# Patient Record
Sex: Female | Born: 1995 | Race: Black or African American | Hispanic: No | Marital: Single | Smoking: Former smoker
Health system: Southern US, Community
[De-identification: ages and names within clinical notes are randomized; demographics above are authoritative.]

## PROBLEM LIST (undated history)

## (undated) DIAGNOSIS — I10 Essential (primary) hypertension: Secondary | ICD-10-CM

---
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
## 2013-03-31 ENCOUNTER — Emergency Department (HOSPITAL_BASED_OUTPATIENT_CLINIC_OR_DEPARTMENT_OTHER): Payer: Medicaid Other

## 2013-03-31 ENCOUNTER — Encounter (HOSPITAL_BASED_OUTPATIENT_CLINIC_OR_DEPARTMENT_OTHER): Payer: Self-pay | Admitting: *Deleted

## 2013-03-31 ENCOUNTER — Emergency Department (HOSPITAL_BASED_OUTPATIENT_CLINIC_OR_DEPARTMENT_OTHER)
Admission: EM | Admit: 2013-03-31 | Discharge: 2013-03-31 | Disposition: A | Discharge status: Home / Self Care | Payer: Medicaid Other | Attending: Emergency Medicine | Admitting: Emergency Medicine

## 2013-03-31 DIAGNOSIS — N10 Acute tubulo-interstitial nephritis: Secondary | ICD-10-CM | POA: Insufficient documentation

## 2013-03-31 DIAGNOSIS — Z79899 Other long term (current) drug therapy: Secondary | ICD-10-CM | POA: Insufficient documentation

## 2013-03-31 DIAGNOSIS — R110 Nausea: Secondary | ICD-10-CM | POA: Insufficient documentation

## 2013-03-31 DIAGNOSIS — Z3202 Encounter for pregnancy test, result negative: Secondary | ICD-10-CM | POA: Insufficient documentation

## 2013-03-31 DIAGNOSIS — R109 Unspecified abdominal pain: Secondary | ICD-10-CM | POA: Insufficient documentation

## 2013-03-31 LAB — COMPREHENSIVE METABOLIC PANEL
ALT: 14 U/L (ref 0–35)
AST: 17 U/L (ref 0–37)
Alkaline Phosphatase: 51 U/L (ref 39–117)
CO2: 27 mEq/L (ref 19–32)
Calcium: 9.4 mg/dL (ref 8.4–10.5)
GFR calc non Af Amer: 82 mL/min — ABNORMAL LOW (ref >90)
Potassium: 3.5 mEq/L (ref 3.5–5.1)
Sodium: 136 mEq/L (ref 135–145)
Total Protein: 7.6 g/dL (ref 6.0–8.3)

## 2013-03-31 LAB — URINE MICROSCOPIC-ADD ON

## 2013-03-31 LAB — CBC WITH DIFFERENTIAL/PLATELET
Band Neutrophils: 1 % (ref 0–10)
Blasts: 0 %
HCT: 38.3 % (ref 36.0–46.0)
MCH: 31.2 pg (ref 26.0–34.0)
MCHC: 34.2 g/dL (ref 30.0–36.0)
MCV: 91.2 fL (ref 78.0–100.0)
Metamyelocytes Relative: 0 %
Monocytes Absolute: 0.5 10*3/uL (ref 0.1–1.0)
Monocytes Relative: 5 % (ref 3–12)
Myelocytes: 0 %
Platelets: 226 10*3/uL (ref 150–400)
RDW: 12.6 % (ref 11.5–15.5)
WBC: 10.5 10*3/uL (ref 4.0–10.5)

## 2013-03-31 LAB — URINALYSIS, ROUTINE W REFLEX MICROSCOPIC
Ketones, ur: 15 mg/dL — AB
Nitrite: POSITIVE — AB
Protein, ur: 30 mg/dL — AB
Urobilinogen, UA: 1 mg/dL (ref 0.0–1.0)

## 2013-03-31 MED ORDER — CEPHALEXIN 500 MG PO CAPS
500.0000 mg | ORAL_CAPSULE | Freq: Four times a day (QID) | ORAL | Status: DC
Start: 2013-03-31 — End: 2014-10-13

## 2013-03-31 MED ORDER — IOHEXOL 300 MG/ML  SOLN
100.0000 mL | Freq: Once | INTRAMUSCULAR | Status: AC | PRN
  Administered 2013-03-31: 100 mL via INTRAVENOUS

## 2013-03-31 MED ORDER — ONDANSETRON HCL 4 MG PO TABS: 4.0000 mg | ORAL_TABLET | Freq: Four times a day (QID) | ORAL | Status: DC

## 2013-03-31 MED ORDER — HYDROCODONE-ACETAMINOPHEN 5-325 MG PO TABS: 1.0000 | ORAL_TABLET | Freq: Four times a day (QID) | ORAL | Status: DC | PRN

## 2013-03-31 MED ORDER — IOHEXOL 300 MG/ML  SOLN
50.0000 mL | Freq: Once | INTRAMUSCULAR | Status: AC | PRN
  Administered 2013-03-31: 50 mL via ORAL

## 2013-03-31 NOTE — ED Notes (Signed)
Pt reports headache "like a band around the top of my head" x 2 days. No relief with motrin or sinus otc meds.

## 2013-03-31 NOTE — ED Notes (Signed)
Patient transported to CT 

## 2013-03-31 NOTE — ED Notes (Signed)
PT STATES THAT HER LMP WAS 2 MONTHS AGO AND REQUESTS A PREGNANCY TEST.

## 2013-03-31 NOTE — ED Provider Notes (Addendum)
History    CSN: [id number] Arrival date & time 03/31/13  4098  First MD Initiated Contact with Patient 03/31/13 [phone]     Chief Complaint  Patient presents with  . Headache  . REQUESTS PREG TEST    (Consider location/radiation/quality/duration/timing/severity/associated sxs/prior Treatment) Patient is a 17 y.o. female presenting with headaches. The history is provided by the patient.  Headache Pain location:  Frontal Quality:  Dull Radiates to:  Does not radiate Severity currently:  5/10 Severity at highest:  5/10 Onset quality:  Gradual Duration:  2 days Timing:  Constant Progression:  Unchanged Chronicity:  New Similar to prior headaches: no   Relieved by:  Nothing Worsened by:  Light Ineffective treatments:  Acetaminophen and aspirin (sinus medicine) Associated symptoms: abdominal pain and nausea   Associated symptoms: no blurred vision, no congestion, no cough, no diarrhea, no fever, no focal weakness, no myalgias, no neck pain, no sinus pressure, no URI, no visual change and no vomiting   Associated symptoms comment:  Pt states that for 2 days she has had fluctuating right abd pain that is worse with movement such as sitting up from a lying position. Risk factors comment:  Seen by PCP on friday and dx with UTI  History reviewed. No pertinent past medical history. History reviewed. No pertinent past surgical history. History reviewed. No pertinent family history. History  Substance Use Topics  . Smoking status: Never Smoker   . Smokeless tobacco: Not on file  . Alcohol Use: No   OB History   Grav Para Term Preterm Abortions TAB SAB Ect Mult Living                 Review of Systems  Constitutional: Negative for fever.  HENT: Negative for congestion, neck pain and sinus pressure.   Eyes: Negative for blurred vision.  Respiratory: Negative for cough.   Gastrointestinal: Positive for nausea and abdominal pain. Negative for vomiting and diarrhea.  Genitourinary:  Negative for dysuria, vaginal bleeding and vaginal discharge.       LMP 2 months ago  Musculoskeletal: Negative for myalgias.  Neurological: Positive for headaches. Negative for focal weakness.  All other systems reviewed and are negative.    Allergies  Review of patient's allergies indicates no known allergies.  Home Medications   Current Outpatient Rx  Name  Route  Sig  Dispense  Refill  . metroNIDAZOLE (FLAGYL) 500 MG tablet   Oral   Take 1 tablet (500 mg total) by mouth 2 (two) times daily. One po bid x 7 days   14 tablet   0   . permethrin (ELIMITE) 5 % cream      Apply a generous amount of cream from head to feet, leave on for 8 hours, wash off with soap and water. Repeat application if living mites present 14 days after initial treatment.   60 g   0   . ranitidine (ZANTAC) 150 MG tablet   Oral   Take 1 tablet (150 mg total) by mouth 2 (two) times daily.   60 tablet   0    BP 108/66  Pulse 94  Temp(Src) 99.5 F (37.5 C) (Oral)  Resp 16  Ht 5\' 3"  (1.6 m)  Wt 165 lb (74.844 kg)  BMI 29.24 kg/m2  SpO2 100%  LMP 01/29/2013 Physical Exam  Nursing note and vitals reviewed. Constitutional: She is oriented to person, place, and time. She appears well-developed and well-nourished. No distress.  HENT:  Head: Normocephalic and atraumatic.  Right Ear: Tympanic membrane and ear canal normal.  Left Ear: Tympanic membrane and ear canal normal.  Nose: Nose normal.  Mouth/Throat: Oropharynx is clear and moist.  Eyes: Conjunctivae and EOM are normal. Pupils are equal, round, and reactive to light.  Neck: Normal range of motion. Neck supple.  Cardiovascular: Normal rate, regular rhythm and intact distal pulses.   No murmur heard. Pulmonary/Chest: Effort normal and breath sounds normal. No respiratory distress. She has no wheezes. She has no rales.  Abdominal: Soft. She exhibits no distension. There is tenderness. There is rebound. There is no guarding.     Musculoskeletal: Normal range of motion. She exhibits no edema and no tenderness.  Neurological: She is alert and oriented to person, place, and time.  Skin: Skin is warm and dry. No rash noted. No erythema.  Psychiatric: She has a normal mood and affect. Her behavior is normal.    ED Course  Procedures (including critical care time) Labs Reviewed  URINALYSIS, ROUTINE W REFLEX MICROSCOPIC - Abnormal; Notable for the following:    Color, Urine AMBER (*)    APPearance TURBID (*)    Hgb urine dipstick SMALL (*)    Bilirubin Urine SMALL (*)    Ketones, ur 15 (*)    Protein, ur 30 (*)    Nitrite POSITIVE (*)    Leukocytes, UA MODERATE (*)    All other components within normal limits  COMPREHENSIVE METABOLIC PANEL - Abnormal; Notable for the following:    Glucose, Bld 108 (*)    GFR calc non Af Amer 82 (*)    All other components within normal limits  URINE MICROSCOPIC-ADD ON - Abnormal; Notable for the following:    Squamous Epithelial / LPF MANY (*)    Bacteria, UA MANY (*)    All other components within normal limits  URINE CULTURE  PREGNANCY, URINE  CBC WITH DIFFERENTIAL  LIPASE, BLOOD   Ct Abdomen Pelvis W Contrast  03/31/2013   *RADIOLOGY REPORT*  Clinical Data: Right-sided abdominal pain.  Nausea.  CT ABDOMEN AND PELVIS WITH CONTRAST  Technique:  Multidetector CT imaging of the abdomen and pelvis was performed following the standard protocol during bolus administration of intravenous contrast.  Contrast: 50mL OMNIPAQUE IOHEXOL 300 MG/ML  SOLN, OMNIPAQUE IOHEXOL 300 MG/ML  SOLN  Comparison: None.  Findings: The liver, spleen, pancreas, and adrenal glands appear unremarkable.  The gallbladder and biliary system appear unremarkable.  Patchy regions of low density/hypo enhancement in the right kidney are accompanied by a faint right perirenal stranding. Although these regions are indistinct, no definite peripheral rim sign is observed.  No specific renal arterial  abnormalities are observed. No stones or hydronephrosis.  Left kidney unremarkable.  Retroaortic left renal vein incidentally noted.  No pathologic adenopathy observed.  Urinary bladder unremarkable. The uterus and adnexa appear unremarkable.  No free pelvic fluid.  Appendix normal.  IMPRESSION:  1.  Mild right perirenal stranding associated with to indistinct regions of abnormal low enhancement in the right mid to upper kidney.  Pyelonephritis is favored.  Renal infarcts are not favored due to lack of rim sign, the patient age, and the lack of obvious renal arterial abnormality.  Correlate with urine analysis and urine culture.   Original Report Authenticated By: Gaylyn Rong, M.D.   1. Pyelonephritis, acute     MDM   Patient is here complaining of 2 days of headache and has been a band like pattern over her forehead with mild nausea but no vomiting.  She denies fever, URI symptoms or tick bite. No neck pain.  Well appearing on exam. Patient's last menstrual period was over 2 months ago and she has not taken urine pregnancy test. This could be the result of her headache. Headache symptoms are not suggestive of subarachnoid hemorrhage, infectious etiology or [facility] spotted fever.  Patient states she's been on antibiotics for the last 4 days for a UTI when she was seen by her doctor but she did not have a urine pregnancy test done at that time. She denies any vaginal discharge but states she has had persistently worsening right-sided abdominal pain that is worse with movement and eating.  UA contaminated with many epithelial cells and unclear if true UTI and abx for 4 days and UPT neg.  Concern for possible appy.  CBC, CMP and CT abd/pelvis pending.  Low concern for pelvic or ovarian pathology at this time given exam and hx.  10:51 AM CT showing pyelo.  No other acute findings.  Pt currently on macrobid and will change to keflex and culture sent.  Gwyneth Sprout, MD 03/31/13 1051  Gwyneth Sprout, MD 03/31/13 1053

## 2013-04-02 LAB — URINE CULTURE

## 2013-04-03 NOTE — ED Notes (Signed)
Post ED Visit - Positive Culture Follow-up  Culture report reviewed by antimicrobial stewardship pharmacist: []  Wes Dulaney, Pharm.D., BCPS [x]  Celedonio Miyamoto, [address].D., BCPS []  Georgina Pillion, Pharm.D., BCPS []  [place], [place].D., BCPS, AAHIVP []  Estella Husk, Pharm.D., BCPS, AAHIVP  Positive urine culture Treated with Cephalexin, organism sensitive to the same and no further patient follow-up is required at this time.  Larena Sox 04/03/2013, 2:16 PM

## 2014-10-13 ENCOUNTER — Encounter: Payer: Self-pay | Admitting: *Deleted

## 2014-10-13 ENCOUNTER — Emergency Department (INDEPENDENT_AMBULATORY_CARE_PROVIDER_SITE_OTHER)
Admission: EM | Admit: 2014-10-13 | Discharge: 2014-10-13 | Disposition: A | Discharge status: Home / Self Care | Payer: BLUE CROSS/BLUE SHIELD | Source: Home / Self Care | Attending: Family Medicine | Admitting: Family Medicine

## 2014-10-13 DIAGNOSIS — N760 Acute vaginitis: Secondary | ICD-10-CM

## 2014-10-13 DIAGNOSIS — B9689 Other specified bacterial agents as the cause of diseases classified elsewhere: Secondary | ICD-10-CM

## 2014-10-13 DIAGNOSIS — R109 Unspecified abdominal pain: Secondary | ICD-10-CM

## 2014-10-13 DIAGNOSIS — N898 Other specified noninflammatory disorders of vagina: Secondary | ICD-10-CM

## 2014-10-13 DIAGNOSIS — A499 Bacterial infection, unspecified: Secondary | ICD-10-CM

## 2014-10-13 DIAGNOSIS — Z202 Contact with and (suspected) exposure to infections with a predominantly sexual mode of transmission: Secondary | ICD-10-CM

## 2014-10-13 LAB — POCT URINALYSIS DIP (MANUAL ENTRY)
Blood, UA: NEGATIVE
GLUCOSE UA: NEGATIVE
Leukocytes, UA: NEGATIVE
NITRITE UA: NEGATIVE
Protein Ur, POC: 30
Urobilinogen, UA: 0.2 (ref 0–1)
pH, UA: 5.5 (ref 5–8)

## 2014-10-13 LAB — POCT URINE PREGNANCY: PREG TEST UR: NEGATIVE

## 2014-10-13 MED ORDER — METRONIDAZOLE 0.75 % VA GEL: VAGINAL | Status: DC

## 2014-10-13 NOTE — ED Notes (Signed)
Pt c/o lower abd pain x 2 wks. She also c/o white thick vaginal d/c x 1 day. Denies dysuria or fever.

## 2014-10-13 NOTE — Discharge Instructions (Signed)
Bacterial Vaginosis Bacterial vaginosis is a vaginal infection that occurs when the normal balance of bacteria in the vagina is disrupted. It results from an overgrowth of certain bacteria. This is the most common vaginal infection in women of childbearing age. Treatment is important to prevent complications, especially in pregnant women, as it can cause a premature delivery. CAUSES  Bacterial vaginosis is caused by an increase in harmful bacteria that are normally present in smaller amounts in the vagina. Several different kinds of bacteria can cause bacterial vaginosis. However, the reason that the condition develops is not fully understood. RISK FACTORS Certain activities or behaviors can put you at an increased risk of developing bacterial vaginosis, including:  Having a new sex partner or multiple sex partners.  Douching.  Using an intrauterine device (IUD) for contraception. Women do not get bacterial vaginosis from toilet seats, bedding, swimming pools, or contact with objects around them. SIGNS AND SYMPTOMS  Some women with bacterial vaginosis have no signs or symptoms. Common symptoms include:  Grey vaginal discharge.  A fishlike odor with discharge, especially after sexual intercourse.  Itching or burning of the vagina and vulva.  Burning or pain with urination. DIAGNOSIS  Your health care provider will take a medical history and examine the vagina for signs of bacterial vaginosis. A sample of vaginal fluid may be taken. Your health care provider will look at this sample under a microscope to check for bacteria and abnormal cells. A vaginal pH test may also be done.  TREATMENT  Bacterial vaginosis may be treated with antibiotic medicines. These may be given in the form of a pill or a vaginal cream. A second round of antibiotics may be prescribed if the condition comes back after treatment.  HOME CARE INSTRUCTIONS   Only take over-the-counter or prescription medicines as  directed by your health care provider.  If antibiotic medicine was prescribed, take it as directed. Make sure you finish it even if you start to feel better.  Do not have sex until treatment is completed.  Tell all sexual partners that you have a vaginal infection. They should see their health care provider and be treated if they have problems, such as a mild rash or itching.  Practice safe sex by using condoms and only having one sex partner. SEEK MEDICAL CARE IF:   Your symptoms are not improving after 3 days of treatment.  You have increased discharge or pain.  You have a fever. MAKE SURE YOU:   Understand these instructions.  Will watch your condition.  Will get help right away if you are not doing well or get worse. FOR MORE INFORMATION  Centers for Disease Control and Prevention, Division of STD Prevention: www.cdc.gov/std American Sexual Health Association (ASHA): www.ashastd.org  Document Released: 09/24/2005 Document Revised: 07/15/2013 Document Reviewed: 05/06/2013 ExitCare Patient Information 2015 [employer]. This information is not intended to replace advice given to you by your health care provider. Make sure you discuss any questions you have with your health care provider.  

## 2014-10-13 NOTE — ED Provider Notes (Signed)
CSN: [id number]     Arrival date & time 10/13/14  1849 History   First MD Initiated Contact with Patient 10/13/14 1938     Chief Complaint  Patient presents with  . Vaginal Discharge  . Abdominal Pain      HPI Comments: Patient complains of vague mild lower abdominal discomfort for two weeks.  She has had white vaginal discharge for one day.  No nausea/vomiting.  No urinary symptoms.  No fevers, chills, and sweats.  Patient's last menstrual period was 09/03/2014.  She states that [facility].  Patient is a 19 y.o. female presenting with vaginal discharge. The history is provided by the patient.  Vaginal Discharge Quality:  Thick and white Severity:  Moderate Onset quality:  Sudden Duration:  1 day Timing:  Constant Progression:  Unchanged Chronicity:  New Relieved by:  Nothing Worsened by:  Nothing tried Ineffective treatments:  None tried Associated symptoms: abdominal pain and nausea   Associated symptoms: no dysuria, no fever, no genital lesions, no rash, no urinary frequency, no urinary hesitancy, no urinary incontinence, no vaginal itching and no vomiting   Risk factors: no STI exposure     History reviewed. No pertinent past medical history. History reviewed. No pertinent past surgical history. History reviewed. No pertinent family history. History  Substance Use Topics  . Smoking status: Never Smoker   . Smokeless tobacco: Not on file  . Alcohol Use: No   OB History    No data available     Review of Systems  Constitutional: Negative for fever.  Gastrointestinal: Positive for nausea and abdominal pain. Negative for vomiting.  Genitourinary: Positive for vaginal discharge. Negative for bladder incontinence, dysuria and hesitancy.  All other systems reviewed and are negative.   Allergies  Review of patient's allergies indicates no known allergies.  Home Medications   Prior to Admission medications   Medication Sig Start Date End Date  Taking? Authorizing Provider  metroNIDAZOLE (METROGEL VAGINAL) 0.75 % vaginal gel Place one applicatorful in vagina QHS for 5 days 10/13/14   Lattie HawStephen A Nyko Gell, MD  ondansetron (ZOFRAN) 4 MG tablet Take 1 tablet (4 mg total) by mouth every 6 (six) hours. 03/31/13   Gwyneth SproutWhitney Plunkett, MD   BP 122/75 mmHg  Pulse 90  Temp(Src) 98.3 F (36.8 C) (Oral)  Resp 16  Ht 5\' 3"  (1.6 m)  Wt 171 lb (77.565 kg)  BMI 30.30 kg/m2  SpO2 99%  LMP 09/03/2014 Physical Exam Nursing notes and Vital Signs reviewed. Appearance:  Patient appears stated age, and in no acute distress.  Patient is obese (BMI 30.3) Eyes:  Pupils are equal, round, and reactive to light and accomodation.  Extraocular movement is intact.  Conjunctivae are not inflamed  Ears:  Canals normal.  Tympanic membranes normal.  Nose:   Normal turbinates.  No sinus tenderness.   Pharynx:  Normal Neck:  Supple.   No adenopathy Lungs:  Clear to auscultation.  Breath sounds are equal.  Heart:  Regular rate and rhythm without murmurs, rubs, or gallops.  Abdomen:  Nontender without masses or hepatosplenomegaly.  Bowel sounds are present.  No CVA or flank tenderness.  Extremities:  No edema.  No calf tenderness Skin:  No rash present.  Genitourinary:  Vulva appears normal without lesions or erythema.  Vagina has normal mucosae without lesions.  There is a small amount of thin white discharge in the vaginal vault.  Cervix appears normal without lesions.  There is small  amount of clear discharge present in the cervical os.  No cervical motion tenderness is present.   Uterus is small and non-tender.  Adnexae are non-tender without masses.   ED Course  Procedures  None   Labs Reviewed  GC/CHLAMYDIA PROBE AMP, URINE  GC/CHLAMYDIA PROBE AMP, GENITAL  POCT URINALYSIS DIP (MANUAL ENTRY):  BIL small; KET trace; SG >= 1.030; PRO /dL; otherwise negative  POCT URINE PREGNANCY negative  POCT WET + KOH PREP (DR PERFORMED @ [facility]):  Moderate clue cells; No trich;  No WBC; No yeast   MDM   1. Possible exposure to STD   2. Vaginal discharge   3. Bacterial vaginosis    GC/chlamydia pending Begin Metrogel vaginal. Followup with Family Doctor if not improved in one week.     Lattie Haw, MD 10/16/14 1026

## 2014-10-14 LAB — GC/CHLAMYDIA PROBE AMP
CT Probe RNA: NEGATIVE
GC Probe RNA: NEGATIVE

## 2014-10-15 ENCOUNTER — Telehealth: Payer: Self-pay | Admitting: Emergency Medicine

## 2015-01-08 ENCOUNTER — Emergency Department (HOSPITAL_BASED_OUTPATIENT_CLINIC_OR_DEPARTMENT_OTHER)
Admission: EM | Admit: 2015-01-08 | Discharge: 2015-01-08 | Disposition: A | Discharge status: Home / Self Care | Payer: BLUE CROSS/BLUE SHIELD | Attending: Emergency Medicine | Admitting: Emergency Medicine

## 2015-01-08 ENCOUNTER — Encounter (HOSPITAL_BASED_OUTPATIENT_CLINIC_OR_DEPARTMENT_OTHER): Payer: Self-pay | Admitting: *Deleted

## 2015-01-08 DIAGNOSIS — Z720 Tobacco use: Secondary | ICD-10-CM | POA: Insufficient documentation

## 2015-01-08 DIAGNOSIS — N390 Urinary tract infection, site not specified: Secondary | ICD-10-CM | POA: Insufficient documentation

## 2015-01-08 DIAGNOSIS — B379 Candidiasis, unspecified: Secondary | ICD-10-CM

## 2015-01-08 DIAGNOSIS — B373 Candidiasis of vulva and vagina: Secondary | ICD-10-CM | POA: Insufficient documentation

## 2015-01-08 DIAGNOSIS — Z3202 Encounter for pregnancy test, result negative: Secondary | ICD-10-CM | POA: Insufficient documentation

## 2015-01-08 LAB — URINALYSIS, ROUTINE W REFLEX MICROSCOPIC
BILIRUBIN URINE: NEGATIVE
GLUCOSE, UA: NEGATIVE mg/dL
Hgb urine dipstick: NEGATIVE
KETONES UR: NEGATIVE mg/dL
NITRITE: POSITIVE — AB
Protein, ur: NEGATIVE mg/dL
Specific Gravity, Urine: 1.026 (ref 1.005–1.030)
UROBILINOGEN UA: 0.2 mg/dL (ref 0.0–1.0)
pH: 6.5 (ref 5.0–8.0)

## 2015-01-08 LAB — URINE MICROSCOPIC-ADD ON

## 2015-01-08 LAB — WET PREP, GENITAL
CLUE CELLS WET PREP: NONE SEEN
Trich, Wet Prep: NONE SEEN
Yeast Wet Prep HPF POC: NONE SEEN

## 2015-01-08 LAB — PREGNANCY, URINE: Preg Test, Ur: NEGATIVE

## 2015-01-08 MED ORDER — FLUCONAZOLE 100 MG PO TABS
200.0000 mg | ORAL_TABLET | Freq: Once | ORAL | Status: AC
  Administered 2015-01-08: 200 mg via ORAL
  Filled 2015-01-08: qty 2

## 2015-01-08 MED ORDER — CEPHALEXIN 500 MG PO CAPS: 500.0000 mg | ORAL_CAPSULE | Freq: Three times a day (TID) | ORAL | Status: DC

## 2015-01-08 NOTE — Discharge Instructions (Signed)
Urinary Tract Infection °Urinary tract infections (UTIs) can develop anywhere along your urinary tract. Your urinary tract is your body's drainage system for removing wastes and extra water. Your urinary tract includes two kidneys, two ureters, a bladder, and a urethra. Your kidneys are a pair of bean-shaped organs. Each kidney is about the size of your fist. They are located below your ribs, one on each side of your spine. °CAUSES °Infections are caused by microbes, which are microscopic organisms, including fungi, viruses, and bacteria. These organisms are so small that they can only be seen through a microscope. Bacteria are the microbes that most commonly cause UTIs. °SYMPTOMS  °Symptoms of UTIs may vary by age and gender of the patient and by the location of the infection. Symptoms in young women typically include a frequent and intense urge to urinate and a painful, burning feeling in the bladder or urethra during urination. Older women and men are more likely to be tired, shaky, and weak and have muscle aches and abdominal pain. A fever may mean the infection is in your kidneys. Other symptoms of a kidney infection include pain in your back or sides below the ribs, nausea, and vomiting. °DIAGNOSIS °To diagnose a UTI, your caregiver will ask you about your symptoms. Your caregiver also will ask to provide a urine sample. The urine sample will be tested for bacteria and white blood cells. White blood cells are made by your body to help fight infection. °TREATMENT  °Typically, UTIs can be treated with medication. Because most UTIs are caused by a bacterial infection, they usually can be treated with the use of antibiotics. The choice of antibiotic and length of treatment depend on your symptoms and the type of bacteria causing your infection. °HOME CARE INSTRUCTIONS °· If you were prescribed antibiotics, take them exactly as your caregiver instructs you. Finish the medication even if you feel better after you  have only taken some of the medication. °· Drink enough water and fluids to keep your urine clear or pale yellow. °· Avoid caffeine, tea, and carbonated beverages. They tend to irritate your bladder. °· Empty your bladder often. Avoid holding urine for long periods of time. °· Empty your bladder before and after sexual intercourse. °· After a bowel movement, women should cleanse from front to back. Use each tissue only once. °SEEK MEDICAL CARE IF:  °· You have back pain. °· You develop a fever. °· Your symptoms do not begin to resolve within 3 days. °SEEK IMMEDIATE MEDICAL CARE IF:  °· You have severe back pain or lower abdominal pain. °· You develop chills. °· You have nausea or vomiting. °· You have continued burning or discomfort with urination. °MAKE SURE YOU:  °· Understand these instructions. °· Will watch your condition. °· Will get help right away if you are not doing well or get worse. °Document Released: 07/04/2005 Document Revised: 03/25/2012 Document Reviewed: 11/02/2011 °ExitCare® Patient Information ©2015 [employer]. This information is not intended to replace advice given to you by your health care provider. Make sure you discuss any questions you have with your health care provider. ° ° °Emergency Department Resource Guide °1) Find a Doctor and Pay Out of Pocket °Although you won't have to find out who is covered by your insurance plan, it is a good idea to ask around and get recommendations. You will then need to call the office and see if the doctor you have chosen will accept you as a new patient and what types of   options they offer for patients who are self-pay. Some doctors offer discounts or will set up payment plans for their patients who do not have insurance, but you will need to ask so you aren't surprised when you get to your appointment. ° °2) Contact Your Local Health Department °Not all health departments have doctors that can see patients for sick visits, but many do, so it is worth  a call to see if yours does. If you don't know where your local health department is, you can check in your phone book. The CDC also has a tool to help you locate your state's health department, and many state websites also have listings of all of their local health departments. ° °3) Find a Walk-in Clinic °If your illness is not likely to be very severe or complicated, you may want to try a walk in clinic. These are popping up all over the country in pharmacies, drugstores, and shopping centers. They're usually staffed by nurse practitioners or physician assistants that have been trained to treat common illnesses and complaints. They're usually fairly quick and inexpensive. However, if you have serious medical issues or chronic medical problems, these are probably not your best option. ° °No Primary Care Doctor: °- Call [facility] at  832-8000 - they can help you locate a primary care doctor that  accepts your insurance, provides certain services, etc. °- Physician Referral Service- 1-800-533-3463 ° °Chronic Pain Problems: °Organization         Address  Phone   Notes  °[facility]  (336) 297-2271 Patients need to be referred by their primary care doctor.  ° °Medication Assistance: °Organization         Address  Phone   Notes  °[place] [facility] [address] °[place], [place] [address] (336) 641-8030 --Must be a resident of [place] °-- Must have NO insurance coverage whatsoever (no Medicaid/ Medicare, etc.) °-- The pt. MUST have a primary care doctor that directs their care regularly and follows them in the community °  °[facility]  (866) 331-1348   °[employer]  (888) 892-1162   ° °Agencies that provide inexpensive medical care: °Organization         Address  Phone   Notes  °[facility]  (336) 832-8035   °[facility]    (336) 832-7272   °[facility] [address] °[place], [place]  [address] (336) 832-4777   °[facility] [address], °[place] (336) 271-4999   °[facility]    (336) 373-0678   °[facility]    (336) 272-1050   °[facility] ° [address], [place] Phone:  (336) 832-4444, Fax:  (336) 832-4440 Hours of Operation:  9 am - 6 pm, M-F.  Also accepts Medicaid/Medicare and self-pay.  °[facility] ° [address],  Phone: (336) 832-3150, Fax: (336) 832-3151. Hours of Operation:  8:30 am - 5:30 pm, M-F.  Also accepts Medicaid and self-pay.  °[facility] [place] [address], [place] Phone: (336) 878-6027   °[facility] [address], [place], [place] (336)723-1848, Ext. 123 Mondays & Thursdays: 7-9 AM.  First 15 patients are seen on a first come, first serve basis. °  ° °Medicaid-accepting [place] Providers: ° °Organization         Address  Phone   Notes  °  [facility] [address], [place] (336) 641-2100 Also accepts self-pay patients.  °[facility] [address], [place] ° (336) 856-9996   °[facility] [address], [place] (336) 288-8857   °[facility] [address] [place] [address], [place] (336) 299-7000   °Veita Bland [address], [place]  ° (336) 373-1557 Only accepts [place] Access Medicaid patients after they have their name applied to their card.  ° °Self-Pay (no insurance) in [place]: ° °Organization         Address  Phone   Notes  °Sickle Cell Patients, [facility] [address], [place] (336) 832-1970   °[facility] [address], [place] (336) 832-4400   °[facility] ° [address], [place] (336) 992-4800   °[facility]/Dr. Osei-Bonsu ° [address] [place] [address], [place] or [address], [place] (336) 841-8500 Phone number for both [place] and [place] locations is the same.  °[facility] [address], [place] (336) 299-0000   °[facility] [address] [place] [address], [place] or [address] (336) 852-7530 °(336) 878-2260   °[facility] [address], [place] (336) 350-1642, phone; (336) 294-5005, fax Sees patients 1st and 3rd Saturday of every month.  Must not qualify for public or private insurance (i.e. Medicaid, Medicare, [place] Health Choice, Veterans' Benefits) • Household income should be no more than 200% of the poverty level •The clinic cannot treat you if you are pregnant or think you are pregnant • Sexually transmitted diseases are not treated at the clinic.  ° ° °Dental Care: °Organization         Address  Phone  Notes  °[facility] [address], [place] (336) 641-6152 Accepts children up to age 21 who are enrolled in Medicaid or [place] Health Choice; pregnant women with a Medicaid card; and children who have applied for Medicaid or [place] Health Choice, but were declined, whose parents can pay a reduced fee at time of service.  °[facility] [place]  [address], [place] (336) 641-7733 Accepts children up to age 21 who are enrolled in Medicaid or [place] Health Choice; pregnant women with a Medicaid card; and children who have applied for Medicaid or [place] Health Choice, but were declined, whose parents can pay a reduced fee at time of service.  °[facility] ° [address],  (336) 641-4533 Patients are seen by appointment only. Walk-ins are not accepted. [facility] will see patients 18 years of age and older. °Monday - Tuesday (8am-5pm) °Most Wednesdays (8:30-5pm) °$30 per visit, cash only  °[facility] ° [address], [place] (336) 641-4533  Patients are seen by appointment only. Walk-ins are not accepted. [facility] will see patients 18 years of age and older. °One Wednesday Evening (Monthly: Volunteer Based).  $30 per visit, cash only  °[facility]  (919) 537-3737 for adults; Children under age 4, call Graduate Pediatric Dentistry at (919) [phone]. Children aged 4-14, please call (919) [phone] to request a pediatric application. ° Dental services are provided in all areas of dental care including fillings, crowns and bridges, complete and partial   dentures, implants, gum treatment, root canals, and extractions. Preventive care is also provided. Treatment is provided to both adults and children. °Patients are selected via a lottery and there is often a waiting list. °  °[facility] [address], °[place] ° (336) 763-8833 [email or web address] °  °[facility] [address], [place], [place] (336)723-1848, Ext. 123 Second and Fourth Thursday of each month, opens at 6:30 AM; Clinic ends at 9 AM.  Patients are seen on a first-come first-served basis, and a limited number are seen during each clinic.  ° °[facility] ° [address], [place], [place] (336) 723-7904   Eligibility Requirements °You must have lived in [place], [place], or [place] counties for at least the last three months. °  You cannot be eligible for state or federal sponsored healthcare insurance, including Veterans Administration, Medicaid, or Medicare. °  You generally cannot be eligible for healthcare insurance through your employer.  °  How to apply: °Eligibility screenings are held every Tuesday and Wednesday afternoon from 1:00 pm until 4:00 pm. You do not need an appointment for the interview!  °[facility] [address], [place], [place] 336-631-2330   °[facility]  336-342-8273   °[facility]  336-703-3100   °[facility]   336-570-6415   ° °Behavioral Health Resources in the Community: °Intensive Outpatient Programs °Organization         Address  Phone  Notes  °[facility] [address], [place], [place] 336-878-6098   °[facility] [address], [place], [place] 336-832-9800   °[facility] [address], [place], [place] ° 336-882-2125   °[facility] [address],  °[place], [place] 1-800-853-5163 or 336-641-4981   °Substance Abuse Resources °Organization         Address  Phone  Notes  °[facility]  336-882-2125   °[facility]  336-784-9470   °[facility]  336-285-9073   °[facility]  336-845-3988   °Residential & Outpatient Substance Abuse Program  1-800-659-3381   °Psychological Services °Organization         Address  Phone  Notes  °[facility]  336- 832-9600   °[facility]  336- 378-7881   °[facility] [address], [place] 1-800-853-5163 or 336-641-4981   ° °Mobile Crisis Teams °Organization         Address  Phone  Notes  °[facility], Mobile Crisis Care Unit  1-877-626-1772   °Assertive °[facility] ° [address] [place], [place] 336-834-9664   °Sharon DeEsch [address] °[place] 336-554-5454   ° °Self-Help/Support Groups °Organization         Address  Phone             Notes  °[facility] - variety of support groups  336- 373-1402 Call for more information  °Narcotics Anonymous (NA), [facility] [address], °[place]  2 meetings at this location  ° °Residential Treatment Programs °Organization         Address  Phone  Notes  °[facility] [address],    °[place]   1-866-801-8205   °[facility] ° [address], [place],  704-293-8524   °[facility] [address], [place] 336-845-3988 Admissions: 8am-3pm M-F   °  [facility] [address]    °[place], [place] 336-841-1104   °[facility] [address], [place], [place] 336-379-7146   °[facility] [address]  °[place], [place] 336-285-9073   °[facility] Intensive Outpatient [address], [place], [place] 336-852-3033   °[facility] [address]  °[place], [place] 1-877-615-2722 or 336-784-9470   °[facility] [address] [place], [place] 336-227-7417 Accepts Medicaid  °[facility] [address]  °[place] 1-800-659-3381 Substance Abuse/Addiction Treatment  ° °[place] Behavioral Health Resources °Organization         Address  Phone  Notes  °[facility]  (888) 581-9988   °Julie Brannon, PhD [address] [place], [place]   (336) 349-5553 or (336) 951-0000   °[facility]   [address] °[place], [place] (336) 349-4454   °[facility] [address], [place], [place] (336) 342-8316 Insurance/Medicaid/sponsorship through [facility]  °[facility] [address]                                    [place], [place] (336) 342-8316 Therapy/tele-psych/case  °[facility] [address]  ° [place], [place] (336) 349-2233    °Dr. Arfeen  (336) 349-4544   °[facility] [place]  [employer] [place] [facility] 1) [address], [place] °2) [address], [place] °3)  [address], [place] (336) 349-3220 °(336) 342-7768 ° °(336) 342-8140   °[place] [facility] (336) 342-1394 or (336) 342-3537 (After Hours)    ° ° ° ° °

## 2015-01-08 NOTE — ED Provider Notes (Signed)
CSN: [id number]     Arrival date & time 01/08/15  1050 History   First MD Initiated Contact with Patient 01/08/15 1120     Chief Complaint  Patient presents with  . Vaginal Discharge     (Consider location/radiation/quality/duration/timing/severity/associated sxs/prior Treatment) Patient is a 19 y.o. female presenting with vaginal discharge. The history is provided by the patient.  Vaginal Discharge Quality:  White Severity:  Moderate Onset quality:  Gradual Duration:  2 weeks Timing:  Constant Progression:  Unchanged Chronicity:  Recurrent Context: after intercourse   Relieved by:  Nothing Worsened by:  Nothing tried Associated symptoms: no fever     History reviewed. No pertinent past medical history. History reviewed. No pertinent past surgical history. No family history on file. History  Substance Use Topics  . Smoking status: Current Some Day Smoker  . Smokeless tobacco: Not on file  . Alcohol Use: Yes   OB History    No data available     Review of Systems  Constitutional: Negative for fever.  Respiratory: Negative for cough and shortness of breath.   Genitourinary: Positive for vaginal discharge.  All other systems reviewed and are negative.     Allergies  Review of patient's allergies indicates no known allergies.  Home Medications   Prior to Admission medications   Medication Sig Start Date End Date Taking? Authorizing Provider  metroNIDAZOLE (METROGEL VAGINAL) 0.75 % vaginal gel Place one applicatorful in vagina QHS for 5 days 10/13/14   Lattie HawStephen A Beese, MD  ondansetron (ZOFRAN) 4 MG tablet Take 1 tablet (4 mg total) by mouth every 6 (six) hours. 03/31/13   Gwyneth SproutWhitney Plunkett, MD   BP 112/73 mmHg  Pulse 82  Temp(Src) 99 F (37.2 C) (Oral)  Resp 18  Ht 5\' 4"  (1.626 m)  Wt 175 lb (79.379 kg)  BMI 30.02 kg/m2  SpO2 99% Physical Exam  Constitutional: She is oriented to person, place, and time. She appears well-developed and well-nourished. No  distress.  HENT:  Head: Normocephalic and atraumatic.  Mouth/Throat: Oropharynx is clear and moist.  Eyes: EOM are normal. Pupils are equal, round, and reactive to light.  Neck: Normal range of motion. Neck supple.  Cardiovascular: Normal rate and regular rhythm.  Exam reveals no friction rub.   No murmur heard. Pulmonary/Chest: Effort normal and breath sounds normal. No respiratory distress. She has no wheezes. She has no rales.  Abdominal: Soft. She exhibits no distension. There is no tenderness. There is no rebound.  Musculoskeletal: Normal range of motion. She exhibits no edema.  Neurological: She is alert and oriented to person, place, and time.  Skin: She is not diaphoretic.  Nursing note and vitals reviewed.   ED Course  Procedures (including critical care time) Labs Review Labs Reviewed  URINALYSIS, ROUTINE W REFLEX MICROSCOPIC - Abnormal; Notable for the following:    APPearance CLOUDY (*)    Nitrite POSITIVE (*)    Leukocytes, UA MODERATE (*)    All other components within normal limits  URINE MICROSCOPIC-ADD ON - Abnormal; Notable for the following:    Squamous Epithelial / LPF FEW (*)    Bacteria, UA MANY (*)    All other components within normal limits  PREGNANCY, URINE    Imaging Review No results found.   EKG Interpretation None      MDM   Final diagnoses:  Yeast infection  UTI (lower urinary tract infection)    30F with hx of STDs presents with vaginal discharge for 2 weeks. Also  some occasional dysuria (burning), but that has improved. Has been having unprotected sex with boyfriend. Denies any fever, vomiting, diarrhea. UTI noted on UA, pelvic exam shows thick white curd-like discharge. Will treat with diflucan even though wet prep negative for yeast. Keflex given for UTI. No purulent discharge c/w STD. Instructed to f/u with PCP.   Elwin Mocha, MD 01/08/15 [phone]

## 2015-01-08 NOTE — ED Notes (Signed)
Patient states that she ahs had some abd pain, burning when urinating and vaginal discharge for the past two weeks.

## 2015-01-10 LAB — GC/CHLAMYDIA PROBE AMP (~~LOC~~) NOT AT ARMC
Chlamydia: NEGATIVE
NEISSERIA GONORRHEA: NEGATIVE

## 2015-03-04 ENCOUNTER — Encounter (HOSPITAL_BASED_OUTPATIENT_CLINIC_OR_DEPARTMENT_OTHER): Payer: Self-pay | Admitting: Emergency Medicine

## 2015-03-04 ENCOUNTER — Emergency Department (HOSPITAL_BASED_OUTPATIENT_CLINIC_OR_DEPARTMENT_OTHER)
Admission: EM | Admit: 2015-03-04 | Discharge: 2015-03-04 | Disposition: A | Discharge status: Home / Self Care | Payer: BLUE CROSS/BLUE SHIELD | Attending: Emergency Medicine | Admitting: Emergency Medicine

## 2015-03-04 DIAGNOSIS — Z720 Tobacco use: Secondary | ICD-10-CM | POA: Insufficient documentation

## 2015-03-04 DIAGNOSIS — N760 Acute vaginitis: Secondary | ICD-10-CM

## 2015-03-04 DIAGNOSIS — Z3202 Encounter for pregnancy test, result negative: Secondary | ICD-10-CM | POA: Insufficient documentation

## 2015-03-04 DIAGNOSIS — B9689 Other specified bacterial agents as the cause of diseases classified elsewhere: Secondary | ICD-10-CM

## 2015-03-04 DIAGNOSIS — B3731 Acute candidiasis of vulva and vagina: Secondary | ICD-10-CM

## 2015-03-04 DIAGNOSIS — B373 Candidiasis of vulva and vagina: Secondary | ICD-10-CM | POA: Insufficient documentation

## 2015-03-04 DIAGNOSIS — Z202 Contact with and (suspected) exposure to infections with a predominantly sexual mode of transmission: Secondary | ICD-10-CM | POA: Insufficient documentation

## 2015-03-04 DIAGNOSIS — N390 Urinary tract infection, site not specified: Secondary | ICD-10-CM | POA: Insufficient documentation

## 2015-03-04 LAB — URINALYSIS, ROUTINE W REFLEX MICROSCOPIC
BILIRUBIN URINE: NEGATIVE
Glucose, UA: NEGATIVE mg/dL
Hgb urine dipstick: NEGATIVE
Ketones, ur: NEGATIVE mg/dL
Nitrite: NEGATIVE
PROTEIN: NEGATIVE mg/dL
Specific Gravity, Urine: 1.033 — ABNORMAL HIGH (ref 1.005–1.030)
Urobilinogen, UA: 1 mg/dL (ref 0.0–1.0)
pH: 6.5 (ref 5.0–8.0)

## 2015-03-04 LAB — URINE MICROSCOPIC-ADD ON

## 2015-03-04 LAB — WET PREP, GENITAL: Trich, Wet Prep: NONE SEEN

## 2015-03-04 LAB — PREGNANCY, URINE: Preg Test, Ur: NEGATIVE

## 2015-03-04 MED ORDER — FLUCONAZOLE 50 MG PO TABS
150.0000 mg | ORAL_TABLET | Freq: Once | ORAL | Status: AC
  Administered 2015-03-04: 150 mg via ORAL
  Filled 2015-03-04 (×2): qty 1

## 2015-03-04 MED ORDER — AZITHROMYCIN 250 MG PO TABS
1000.0000 mg | ORAL_TABLET | Freq: Once | ORAL | Status: AC
  Administered 2015-03-04: 1000 mg via ORAL
  Filled 2015-03-04: qty 4

## 2015-03-04 MED ORDER — CEPHALEXIN 500 MG PO CAPS: 500.0000 mg | ORAL_CAPSULE | Freq: Four times a day (QID) | ORAL | Status: DC

## 2015-03-04 MED ORDER — CEFTRIAXONE SODIUM 250 MG IJ SOLR
250.0000 mg | Freq: Once | INTRAMUSCULAR | Status: AC
  Administered 2015-03-04: 250 mg via INTRAMUSCULAR
  Filled 2015-03-04: qty 250

## 2015-03-04 MED ORDER — METRONIDAZOLE 500 MG PO TABS: 500.0000 mg | ORAL_TABLET | Freq: Two times a day (BID) | ORAL | Status: DC

## 2015-03-04 MED ORDER — LIDOCAINE HCL (PF) 2 % IJ SOLN
INTRAMUSCULAR | Status: AC
  Administered 2015-03-04: 5 mL
  Filled 2015-03-04: qty 2

## 2015-03-04 MED ORDER — METRONIDAZOLE 500 MG PO TABS
2000.0000 mg | ORAL_TABLET | ORAL | Status: AC
  Administered 2015-03-04: 2000 mg via ORAL
  Filled 2015-03-04: qty 4

## 2015-03-04 NOTE — ED Notes (Signed)
Pt in c/o pelvic pain with white cottage cheese-like vaginal discharge along with dysuria and urinary frequency. States recent unprotected sex in the last week.

## 2015-03-04 NOTE — ED Provider Notes (Signed)
CSN: [id number]     Arrival date & time 03/04/15  1029 History   First MD Initiated Contact with Patient 03/04/15 1044     Chief Complaint  Patient presents with  . Dysuria     (Consider location/radiation/quality/duration/timing/severity/associated sxs/prior Treatment) Patient is a 19 y.o. female presenting with dysuria. The history is provided by the patient.  Dysuria Pain quality:  Burning Pain severity:  Mild Onset quality:  Gradual Duration:  4 days Timing:  Constant Progression:  Unchanged Chronicity:  New Relieved by:  Nothing Worsened by:  Nothing tried Ineffective treatments:  None tried Associated symptoms: vaginal discharge   Associated symptoms: no abdominal pain, no fever, no nausea and no vomiting   Vaginal discharge:    Quality:  White   Severity:  Moderate   Onset quality:  Gradual   Duration:  4 days   Timing:  Constant   Progression:  Unchanged   Chronicity:  New Risk factors: sexually active     History reviewed. No pertinent past medical history. History reviewed. No pertinent past surgical history. History reviewed. No pertinent family history. History  Substance Use Topics  . Smoking status: Current Some Day Smoker  . Smokeless tobacco: Not on file  . Alcohol Use: Yes   OB History    No data available     Review of Systems  Constitutional: Negative for fever and fatigue.  HENT: Negative for congestion and drooling.   Eyes: Negative for pain.  Respiratory: Negative for cough and shortness of breath.   Cardiovascular: Negative for chest pain.  Gastrointestinal: Negative for nausea, vomiting, abdominal pain and diarrhea.  Genitourinary: Positive for dysuria, urgency and vaginal discharge. Negative for hematuria.  Musculoskeletal: Negative for back pain, gait problem and neck pain.  Skin: Negative for color change.  Neurological: Negative for dizziness and headaches.  Hematological: Negative for adenopathy.  Psychiatric/Behavioral:  Negative for behavioral problems.  All other systems reviewed and are negative.     Allergies  Review of patient's allergies indicates no known allergies.  Home Medications   Prior to Admission medications   Not on File   BP 115/78 mmHg  Pulse 92  Temp(Src) 98.3 F (36.8 C) (Oral)  Resp 18  Ht 5\' 3"  (1.6 m)  Wt 177 lb 14.4 oz (80.695 kg)  BMI 31.52 kg/m2  SpO2 98%  LMP 02/06/2015 Physical Exam  Constitutional: She is oriented to person, place, and time. She appears well-developed and well-nourished.  HENT:  Head: Normocephalic.  Mouth/Throat: Oropharynx is clear and moist. No oropharyngeal exudate.  Eyes: Conjunctivae and EOM are normal. Pupils are equal, round, and reactive to light.  Neck: Normal range of motion. Neck supple.  Cardiovascular: Normal rate, regular rhythm, normal heart sounds and intact distal pulses.  Exam reveals no gallop and no friction rub.   No murmur heard. Pulmonary/Chest: Effort normal and breath sounds normal. No respiratory distress. She has no wheezes.  Abdominal: Soft. Bowel sounds are normal. There is no tenderness. There is no rebound and no guarding.  Genitourinary:  Normal-appearing external vagina. Normal-appearing cervix. Os closed. Moderate amount of thin white milky fluid in the posterior fornix. No cervical motion tenderness. No tenderness during the bimanual exam.  Musculoskeletal: Normal range of motion. She exhibits no edema or tenderness.  Neurological: She is alert and oriented to person, place, and time.  Skin: Skin is warm and dry.  Psychiatric: She has a normal mood and affect. Her behavior is normal.  Nursing note and vitals reviewed.  ED Course  Procedures (including critical care time) Labs Review Labs Reviewed  WET PREP, GENITAL - Abnormal; Notable for the following:    Yeast Wet Prep HPF POC FEW (*)    Clue Cells Wet Prep HPF POC TOO NUMEROUS TO COUNT (*)    WBC, Wet Prep HPF POC MODERATE (*)    All other  components within normal limits  URINALYSIS, ROUTINE W REFLEX MICROSCOPIC (NOT AT [facility]) - Abnormal; Notable for the following:    APPearance CLOUDY (*)    Specific Gravity, Urine 1.033 (*)    Leukocytes, UA SMALL (*)    All other components within normal limits  URINE MICROSCOPIC-ADD ON - Abnormal; Notable for the following:    Squamous Epithelial / LPF MANY (*)    Bacteria, UA MANY (*)    All other components within normal limits  PREGNANCY, URINE  GC/CHLAMYDIA PROBE AMP ([place]) NOT AT [facility]    Imaging Review No results found.   EKG Interpretation None      MDM   Final diagnoses:  BV (bacterial vaginosis)  Vaginal candidiasis  UTI (lower urinary tract infection)  Possible exposure to STD    11:09 AM 19 y.o. female who pw mild pelvic pain, vaginal discharge, dysuria, and urinary frequency which began over the last 3-4 days. She denies any fevers, vomiting, or diarrhea. She has a history of gonorrhea and Chlamydia in the past. She states that she had unprotected sex last week. Vital signs unremarkable here. Abdomen soft and benign. Likely STD. Will treat empirically and performed pelvic exam.  12:16 PM: Empirically treated for STDs. Will also cover for bacterial vaginosis and UTI. Patient treated with 1 dose of fluconazole for yeast here. I have discussed the diagnosis/risks/treatment options with the patient and believe the pt to be eligible for discharge home to follow-up with your pcp as needed. We also discussed returning to the ED immediately if new or worsening sx occur. We discussed the sx which are most concerning (e.g., worsening pain, fever, continued sx) that necessitate immediate return. Medications administered to the patient during their visit and any new prescriptions provided to the patient are listed below.  Medications given during this visit Medications  cefTRIAXone (ROCEPHIN) injection 250 mg (250 mg Intramuscular Given 03/04/15 1137)  metroNIDAZOLE  (FLAGYL) tablet 2,000 mg (2,000 mg Oral Given 03/04/15 1135)  azithromycin (ZITHROMAX) tablet 1,000 mg (1,000 mg Oral Given 03/04/15 1134)  lidocaine (XYLOCAINE) 2 % injection (5 mLs  Given 03/04/15 1137)  fluconazole (DIFLUCAN) tablet 150 mg (150 mg Oral Given 03/04/15 1210)    New Prescriptions   CEPHALEXIN (KEFLEX) 500 MG CAPSULE    Take 1 capsule (500 mg total) by mouth 4 (four) times daily.   METRONIDAZOLE (FLAGYL) 500 MG TABLET    Take 1 tablet (500 mg total) by mouth 2 (two) times daily. One po bid x 7 days     Purvis Sheffield, MD 03/04/15 1217

## 2015-03-08 LAB — GC/CHLAMYDIA PROBE AMP (~~LOC~~) NOT AT ARMC
Chlamydia: NEGATIVE
NEISSERIA GONORRHEA: NEGATIVE

## 2016-01-26 ENCOUNTER — Emergency Department (HOSPITAL_BASED_OUTPATIENT_CLINIC_OR_DEPARTMENT_OTHER)
Admission: EM | Admit: 2016-01-26 | Discharge: 2016-01-26 | Disposition: A | Discharge status: Home / Self Care | Payer: Medicaid Other | Attending: Emergency Medicine | Admitting: Emergency Medicine

## 2016-01-26 ENCOUNTER — Encounter (HOSPITAL_BASED_OUTPATIENT_CLINIC_OR_DEPARTMENT_OTHER): Payer: Self-pay | Admitting: *Deleted

## 2016-01-26 DIAGNOSIS — R067 Sneezing: Secondary | ICD-10-CM | POA: Insufficient documentation

## 2016-01-26 DIAGNOSIS — F17210 Nicotine dependence, cigarettes, uncomplicated: Secondary | ICD-10-CM | POA: Insufficient documentation

## 2016-01-26 DIAGNOSIS — R0981 Nasal congestion: Secondary | ICD-10-CM | POA: Insufficient documentation

## 2016-01-26 NOTE — ED Notes (Signed)
Nasal congestion and sneezing since yesterday. States she thinks it is sinus congestion.

## 2016-10-08 ENCOUNTER — Encounter (HOSPITAL_BASED_OUTPATIENT_CLINIC_OR_DEPARTMENT_OTHER): Payer: Self-pay

## 2016-10-08 ENCOUNTER — Emergency Department (HOSPITAL_BASED_OUTPATIENT_CLINIC_OR_DEPARTMENT_OTHER)
Admission: EM | Admit: 2016-10-08 | Discharge: 2016-10-08 | Disposition: A | Discharge status: Home / Self Care | Payer: Medicaid Other | Attending: Emergency Medicine | Admitting: Emergency Medicine

## 2016-10-08 DIAGNOSIS — F17210 Nicotine dependence, cigarettes, uncomplicated: Secondary | ICD-10-CM | POA: Insufficient documentation

## 2016-10-08 DIAGNOSIS — N760 Acute vaginitis: Secondary | ICD-10-CM | POA: Insufficient documentation

## 2016-10-08 DIAGNOSIS — B9689 Other specified bacterial agents as the cause of diseases classified elsewhere: Secondary | ICD-10-CM

## 2016-10-08 LAB — URINALYSIS, ROUTINE W REFLEX MICROSCOPIC
Bilirubin Urine: NEGATIVE
GLUCOSE, UA: NEGATIVE mg/dL
HGB URINE DIPSTICK: NEGATIVE
Ketones, ur: NEGATIVE mg/dL
Leukocytes, UA: NEGATIVE
Nitrite: NEGATIVE
Protein, ur: NEGATIVE mg/dL
SPECIFIC GRAVITY, URINE: 1.03 (ref 1.005–1.030)
pH: 6.5 (ref 5.0–8.0)

## 2016-10-08 LAB — WET PREP, GENITAL
SPERM: NONE SEEN
Trich, Wet Prep: NONE SEEN
Yeast Wet Prep HPF POC: NONE SEEN

## 2016-10-08 LAB — PREGNANCY, URINE: PREG TEST UR: NEGATIVE

## 2016-10-08 MED ORDER — METRONIDAZOLE 0.75 % VA GEL: 1.0000 | Freq: Two times a day (BID) | VAGINAL | 0 refills | Status: DC

## 2016-10-08 MED ORDER — METRONIDAZOLE 500 MG PO TABS: 500.0000 mg | ORAL_TABLET | Freq: Two times a day (BID) | ORAL | 0 refills | Status: DC

## 2016-10-08 MED ORDER — FLUCONAZOLE 50 MG PO TABS
150.0000 mg | ORAL_TABLET | Freq: Once | ORAL | Status: AC
  Administered 2016-10-08: 150 mg via ORAL
  Filled 2016-10-08: qty 1

## 2016-10-08 NOTE — ED Triage Notes (Signed)
Pt reports vaginal discharge x 3 day. Reports thick malodorous, white discharge. Pt is sexually active with 1 partner, doesn't use protection.

## 2016-10-08 NOTE — ED Provider Notes (Signed)
[facility] Provider Note: Lowella Dell, MD, FACEP  CSN: [id number] MRN: [id number] ARRIVAL: 10/08/16 at 0139 ROOM: MH02/MH02   CHIEF COMPLAINT  Vaginal Discharge   HISTORY OF PRESENT ILLNESS  Lisa Gibson is a 21 y.o. female with a three-day history of vaginal discharge. She reports the discharge is thick, foul-smelling and white. She is sexually active with one partner and does not use protection. She is not having pain with this. She denies dysuria.   History reviewed. No pertinent past medical history.  History reviewed. No pertinent surgical history.  No family history on file.  Social History  Substance Use Topics  . Smoking status: Current Some Day Smoker    Types: Cigarettes  . Smokeless tobacco: Never Used  . Alcohol use Yes    Prior to Admission medications   Medication Sig Start Date End Date Taking? Authorizing Provider  metroNIDAZOLE (FLAGYL) 500 MG tablet Take 1 tablet (500 mg total) by mouth 2 (two) times daily. One po bid x 7 days 10/08/16   Paula Libra, MD    Allergies Patient has no known allergies.   REVIEW OF SYSTEMS  Negative except as noted here or in the History of Present Illness.   PHYSICAL EXAMINATION  Initial Vital Signs Blood pressure 164/99, pulse 102, temperature 99.3 F (37.4 C), temperature source Oral, resp. rate 16, height 5\' 4"  (1.626 m), weight 183 lb (83 kg), last menstrual period 09/10/2016, SpO2 99 %.  Examination General: Well-developed, well-nourished female in no acute distress; appearance consistent with age of record HENT: normocephalic; atraumatic Eyes: pupils equal, round and reactive to light; extraocular muscles intact Neck: supple Heart: regular rate and rhythm Lungs: clear to auscultation bilaterally Abdomen: soft; nondistended; nontender; no masses or hepatosplenomegaly; bowel sounds present GU: Normal external genitalia; white, curd-like vaginal discharge; no cervical motion tenderness; no  adnexal tenderness Extremities: No deformity; full range of motion; pulses normal Neurologic: Awake, alert and oriented; motor function intact in all extremities and symmetric; no facial droop Skin: Warm and dry Psychiatric: Normal mood and affect   RESULTS  Summary of this visit's results, reviewed by myself:   EKG Interpretation  Date/Time:    Ventricular Rate:    PR Interval:    QRS Duration:   QT Interval:    QTC Calculation:   R Axis:     Text Interpretation:        Laboratory Studies: Results for orders placed or performed during the hospital encounter of 10/08/16 (from the past 24 hour(s))  Urinalysis, Routine w reflex microscopic     Status: Abnormal   Collection Time: 10/08/16  1:50 AM  Result Value Ref Range   Color, Urine YELLOW YELLOW   APPearance CLOUDY (A) CLEAR   Specific Gravity, Urine 1.030 1.005 - 1.030   pH 6.5 5.0 - 8.0   Glucose, UA NEGATIVE NEGATIVE mg/dL   Hgb urine dipstick NEGATIVE NEGATIVE   Bilirubin Urine NEGATIVE NEGATIVE   Ketones, ur NEGATIVE NEGATIVE mg/dL   Protein, ur NEGATIVE NEGATIVE mg/dL   Nitrite NEGATIVE NEGATIVE   Leukocytes, UA NEGATIVE NEGATIVE  Pregnancy, urine     Status: None   Collection Time: 10/08/16  1:50 AM  Result Value Ref Range   Preg Test, Ur NEGATIVE NEGATIVE  Wet prep, genital     Status: Abnormal   Collection Time: 10/08/16  2:00 AM  Result Value Ref Range   Yeast Wet Prep HPF POC NONE SEEN NONE SEEN   Trich, Wet Prep NONE SEEN NONE  SEEN   Clue Cells Wet Prep HPF POC PRESENT (A) NONE SEEN   WBC, Wet Prep HPF POC MODERATE (A) NONE SEEN   Sperm NONE SEEN    Imaging Studies: No results found.  ED COURSE  Nursing notes and initial vitals signs, including pulse oximetry, reviewed.  Vitals:   10/08/16 0150 10/08/16 0151  BP: 164/99   Pulse: 102   Resp: 16   Temp: 99.3 F (37.4 C)   TempSrc: Oral   SpO2: 99%   Weight:  183 lb (83 kg)  Height:  5\' 4"  (1.626 m)   Will treat for yeast and  BV.  PROCEDURES    ED DIAGNOSES     ICD-9-CM ICD-10-CM   1. BV (bacterial vaginosis) 616.10 N76.0    041.9 B96.89        Paula LibraJohn Saryna Kneeland, MD 10/08/16 [phone]

## 2016-10-09 LAB — GC/CHLAMYDIA PROBE AMP (~~LOC~~) NOT AT ARMC
Chlamydia: NEGATIVE
Neisseria Gonorrhea: NEGATIVE

## 2016-12-13 ENCOUNTER — Encounter (HOSPITAL_BASED_OUTPATIENT_CLINIC_OR_DEPARTMENT_OTHER): Payer: Self-pay | Admitting: *Deleted

## 2016-12-13 ENCOUNTER — Emergency Department (HOSPITAL_BASED_OUTPATIENT_CLINIC_OR_DEPARTMENT_OTHER)
Admission: EM | Admit: 2016-12-13 | Discharge: 2016-12-13 | Disposition: A | Discharge status: Home / Self Care | Payer: Medicaid Other | Attending: Emergency Medicine | Admitting: Emergency Medicine

## 2016-12-13 DIAGNOSIS — B9689 Other specified bacterial agents as the cause of diseases classified elsewhere: Secondary | ICD-10-CM

## 2016-12-13 DIAGNOSIS — N760 Acute vaginitis: Secondary | ICD-10-CM | POA: Insufficient documentation

## 2016-12-13 DIAGNOSIS — F17290 Nicotine dependence, other tobacco product, uncomplicated: Secondary | ICD-10-CM | POA: Insufficient documentation

## 2016-12-13 DIAGNOSIS — N390 Urinary tract infection, site not specified: Secondary | ICD-10-CM | POA: Insufficient documentation

## 2016-12-13 LAB — URINALYSIS, MICROSCOPIC (REFLEX)

## 2016-12-13 LAB — URINALYSIS, ROUTINE W REFLEX MICROSCOPIC
BILIRUBIN URINE: NEGATIVE
Glucose, UA: NEGATIVE mg/dL
Ketones, ur: NEGATIVE mg/dL
NITRITE: POSITIVE — AB
PROTEIN: NEGATIVE mg/dL
Specific Gravity, Urine: 1.025 (ref 1.005–1.030)
pH: 5.5 (ref 5.0–8.0)

## 2016-12-13 LAB — WET PREP, GENITAL
Sperm: NONE SEEN
Trich, Wet Prep: NONE SEEN
Yeast Wet Prep HPF POC: NONE SEEN

## 2016-12-13 LAB — PREGNANCY, URINE: PREG TEST UR: NEGATIVE

## 2016-12-13 MED ORDER — CEFTRIAXONE SODIUM 250 MG IJ SOLR
250.0000 mg | Freq: Once | INTRAMUSCULAR | Status: AC
Start: 2016-12-13 — End: 2016-12-13
  Administered 2016-12-13: 250 mg via INTRAMUSCULAR
  Filled 2016-12-13: qty 250

## 2016-12-13 MED ORDER — METRONIDAZOLE 500 MG PO TABS: 500.0000 mg | ORAL_TABLET | Freq: Two times a day (BID) | ORAL | 0 refills | Status: DC

## 2016-12-13 MED ORDER — AZITHROMYCIN 250 MG PO TABS
1000.0000 mg | ORAL_TABLET | Freq: Once | ORAL | Status: AC
  Administered 2016-12-13: 1000 mg via ORAL
  Filled 2016-12-13: qty 4

## 2016-12-13 MED ORDER — NITROFURANTOIN MONOHYD MACRO 100 MG PO CAPS: 100.0000 mg | ORAL_CAPSULE | Freq: Two times a day (BID) | ORAL | 0 refills | Status: DC

## 2016-12-13 MED FILL — NITROFURANTOIN MONO-MCR 100: 100 | 7 days supply | Qty: 14 | Fill #0

## 2016-12-13 MED FILL — metroNIDAZOLE 500 MG TABS: 500 | 7 days supply | Qty: 14 | Fill #0

## 2016-12-13 NOTE — Discharge Instructions (Signed)
Take antibiotics as described.  Follow-up on your other studies on MyChart.  Return for worsening symptoms, including fever, severe back pain, worsening abdominal pain, intractable vomiting or any other symptoms concerning to you.

## 2016-12-13 NOTE — ED Triage Notes (Signed)
C/o white/yellow discharge x 1 week and low mid abd pain. Took morning after pill 1 week ago. C/o having a light period now but usually starts around first of month.

## 2016-12-13 NOTE — ED Provider Notes (Signed)
[facility]   CSN: [id number] Arrival date & time: 12/13/16  [id number]     History   Chief Complaint Chief Complaint  Patient presents with  . Abdominal Pain    HPI Lisa Gibson is a 21 y.o. female.  The history is provided by the patient.  Vaginal Discharge   This is a new problem. The current episode started more than 1 week ago. The problem occurs constantly. The problem has not changed since onset.The discharge occurs while at rest. The discharge was white and malodorous. She is not pregnant. She has not missed her period. Associated symptoms include abdominal pain and perineal odor. Pertinent negatives include no fever, no diarrhea, no nausea, no vomiting, no dysuria, no frequency and no genital lesions. She has tried nothing for the symptoms. Her past medical history is significant for STD.   Malodorous vaginal discharge for 1.5 weeks. Has had intermittent suprapubic discomfort. Took Plan B a few days ago, and starting to have vaginal spotting but persistent symptoms. Last menstrual period was at the beginning of this month. Has engaged in sexual intercourse without any protection and is concerned about potential STDs.  History reviewed. No pertinent past medical history.  There are no active problems to display for this patient.   History reviewed. No pertinent surgical history.  OB History    No data available       Home Medications    Prior to Admission medications   Medication Sig Start Date End Date Taking? Authorizing Provider  metroNIDAZOLE (FLAGYL) 500 MG tablet Take 1 tablet (500 mg total) by mouth 2 (two) times daily. 12/13/16   Lavera Guiseana Duo Adedamola Seto, MD  nitrofurantoin, macrocrystal-monohydrate, (MACROBID) 100 MG capsule Take 1 capsule (100 mg total) by mouth 2 (two) times daily. 12/13/16   Lavera Guiseana Duo Winthrop Shannahan, MD    Family History No family history on file.  Social History Social History  Substance Use Topics  . Smoking status: Current Some Day  Smoker    Packs/day: 0.50    Types: Cigarettes, Cigars  . Smokeless tobacco: Never Used  . Alcohol use Yes     Allergies   Patient has no known allergies.   Review of Systems Review of Systems  Constitutional: Negative for fever.  Respiratory: Negative for shortness of breath.   Cardiovascular: Negative for chest pain.  Gastrointestinal: Positive for abdominal pain. Negative for diarrhea, nausea and vomiting.  Genitourinary: Positive for vaginal discharge. Negative for dysuria and frequency.  Musculoskeletal: Negative for back pain.  Allergic/Immunologic: Negative for immunocompromised state.  Neurological: Negative for weakness.  Hematological: Does not bruise/bleed easily.  Psychiatric/Behavioral: Negative for confusion.  All other systems reviewed and are negative.    Physical Exam Updated Vital Signs BP 132/94 (BP Location: Right Arm)   Pulse 77   Temp 98.3 F (36.8 C) (Oral)   Resp 16   Ht 5\' 4"  (1.626 m)   Wt 183 lb (83 kg)   LMP 12/08/2016   SpO2 100%   BMI 31.41 kg/m   Physical Exam Physical Exam  Nursing note and vitals reviewed. Constitutional: Well developed, well nourished, non-toxic, and in no acute distress Head: Normocephalic and atraumatic.  Mouth/Throat: Oropharynx is clear and moist.  Neck: Normal range of motion. Neck supple.  Cardiovascular: Normal rate and regular rhythm.   Pulmonary/Chest: Effort normal and breath sounds normal.  Abdominal: Soft. There is no tenderness. There is no rebound and no guarding.  no CVA tenderness  Musculoskeletal: Normal range of motion.  Neurological: Alert, no facial droop, fluent speech, moves all extremities symmetrically Skin: Skin is warm and dry.  Psychiatric: Cooperative  Pelvic: Normal external genitalia. Normal internal genitalia. Copious white vaginal discharge. No blood within the vagina. No cervical motion tenderness. No adnexal masses or tenderness.  ED Treatments / Results  Labs (all labs  ordered are listed, but only abnormal results are displayed) Labs Reviewed  WET PREP, GENITAL - Abnormal; Notable for the following:       Result Value   Clue Cells Wet Prep HPF POC PRESENT (*)    WBC, Wet Prep HPF POC MANY (*)    All other components within normal limits  URINALYSIS, ROUTINE W REFLEX MICROSCOPIC - Abnormal; Notable for the following:    APPearance TURBID (*)    Hgb urine dipstick TRACE (*)    Nitrite POSITIVE (*)    Leukocytes, UA SMALL (*)    All other components within normal limits  URINALYSIS, MICROSCOPIC (REFLEX) - Abnormal; Notable for the following:    Bacteria, UA MANY (*)    Squamous Epithelial / LPF 6-30 (*)    All other components within normal limits  URINE CULTURE  PREGNANCY, URINE  HIV ANTIBODY (ROUTINE TESTING)  RPR  GC/CHLAMYDIA PROBE AMP ([place]) NOT AT [facility]    EKG  EKG Interpretation None       Radiology No results found.  Procedures Procedures (including critical care time)  Medications Ordered in ED Medications  azithromycin (ZITHROMAX) tablet 1,000 mg (1,000 mg Oral Given 12/13/16 0917)  cefTRIAXone (ROCEPHIN) injection 250 mg (250 mg Intramuscular Given 12/13/16 7829)     Initial Impression / Assessment and Plan / ED Course  I have reviewed the triage vital signs and the nursing notes.  Pertinent labs & imaging results that were available during my care of the patient were reviewed by me and considered in my medical decision making (see chart for details).     Presenting with over 1 week of vaginal discharge and intermittent suprapubic abdominal tenderness. She well-appearing and in no acute distress. Vital signs are stable. With a soft and benign abdomen without any significant tenderness.  UA suggestive of UTI with nitrite-positive urine and many bacteria. We'll treat with a course of Macrobid is no signs or symptoms of pyelonephritis.  She requested empiric treatment for STDs, and received ceftriaxone and azithromycin.  GC chlamydia and HIV/syphilis studies are pending. Wet prep positive for BV and also treated with course of Flagyl. Pelvic exam not consistent with PID or TOA at this time.  Strict return and follow-up instructions reviewed. She expressed understanding of all discharge instructions and felt comfortable with the plan of care. The patient appears reasonably screened and/or stabilized for discharge and I doubt any other medical condition or other [facility] requiring further screening, evaluation, or treatment in the ED at this time prior to discharge.    Final Clinical Impressions(s) / ED Diagnoses   Final diagnoses:  Lower urinary tract infectious disease  Bacterial vaginosis    New Prescriptions New Prescriptions   METRONIDAZOLE (FLAGYL) 500 MG TABLET    Take 1 tablet (500 mg total) by mouth 2 (two) times daily.   NITROFURANTOIN, MACROCRYSTAL-MONOHYDRATE, (MACROBID) 100 MG CAPSULE    Take 1 capsule (100 mg total) by mouth 2 (two) times daily.     Lavera Guise, MD 12/13/16 [phone]

## 2016-12-14 LAB — GC/CHLAMYDIA PROBE AMP (~~LOC~~) NOT AT ARMC
Chlamydia: NEGATIVE
Neisseria Gonorrhea: NEGATIVE

## 2016-12-14 LAB — RPR: RPR Ser Ql: NONREACTIVE

## 2016-12-14 LAB — HIV ANTIBODY (ROUTINE TESTING W REFLEX): HIV SCREEN 4TH GENERATION: NONREACTIVE

## 2016-12-15 LAB — URINE CULTURE: Culture: >=100000 — AB

## 2016-12-16 ENCOUNTER — Telehealth: Payer: Self-pay

## 2016-12-16 NOTE — Telephone Encounter (Signed)
Post ED Visit - Positive Culture Follow-up  Culture report reviewed by antimicrobial stewardship pharmacist:  []  Enzo BiNathan Batchelder, Pharm.D. []  Celedonio MiyamotoJeremy Frens, Pharm.D., BCPS []  Garvin FilaMike Maccia, Pharm.D. []  Georgina PillionElizabeth Martin, Pharm.D., BCPS []  HobartMinh Pham, [address]Pharm[address]D., BCPS, AAHIVP []  Estella HuskMichelle Turner, Pharm.D., BCPS, AAHIVP []  Tennis Mustassie Stewart, Pharm.D. []  Sherle Poeob Vincent, [address]Ph[address] Fara Chuteory Ball Pharm D Positive urine culture Treated with Cephalexin, organism sensitive to the same and no further patient follow-up is required at this time.  Jerry CarasCullom, [place] 12/16/2016, 9:21 AM

## 2017-03-27 ENCOUNTER — Encounter (HOSPITAL_BASED_OUTPATIENT_CLINIC_OR_DEPARTMENT_OTHER): Payer: Self-pay | Admitting: Emergency Medicine

## 2017-03-27 ENCOUNTER — Emergency Department (HOSPITAL_BASED_OUTPATIENT_CLINIC_OR_DEPARTMENT_OTHER)
Admission: EM | Admit: 2017-03-27 | Discharge: 2017-03-27 | Disposition: A | Discharge status: Home / Self Care | Payer: Medicaid Other | Attending: Emergency Medicine | Admitting: Emergency Medicine

## 2017-03-27 DIAGNOSIS — N760 Acute vaginitis: Secondary | ICD-10-CM | POA: Insufficient documentation

## 2017-03-27 DIAGNOSIS — B9689 Other specified bacterial agents as the cause of diseases classified elsewhere: Secondary | ICD-10-CM | POA: Insufficient documentation

## 2017-03-27 DIAGNOSIS — F17210 Nicotine dependence, cigarettes, uncomplicated: Secondary | ICD-10-CM | POA: Insufficient documentation

## 2017-03-27 LAB — URINALYSIS, ROUTINE W REFLEX MICROSCOPIC
Bilirubin Urine: NEGATIVE
Glucose, UA: NEGATIVE mg/dL
Hgb urine dipstick: NEGATIVE
KETONES UR: NEGATIVE mg/dL
LEUKOCYTES UA: NEGATIVE
NITRITE: NEGATIVE
PROTEIN: NEGATIVE mg/dL
Specific Gravity, Urine: 1.024 (ref 1.005–1.030)
pH: 6 (ref 5.0–8.0)

## 2017-03-27 LAB — WET PREP, GENITAL
Sperm: NONE SEEN
Trich, Wet Prep: NONE SEEN
YEAST WET PREP: NONE SEEN

## 2017-03-27 LAB — PREGNANCY, URINE: PREG TEST UR: NEGATIVE

## 2017-03-27 MED ORDER — METRONIDAZOLE 500 MG PO TABS: 500.0000 mg | ORAL_TABLET | Freq: Two times a day (BID) | ORAL | 0 refills | Status: DC

## 2017-03-27 NOTE — ED Triage Notes (Signed)
Patient states that she is having lower pelvic pain and a vag discharge

## 2017-03-27 NOTE — Discharge Instructions (Signed)
Take flagyl as prescribed until all gone. Avoid any scented products. Follow up as needed.

## 2017-03-27 NOTE — ED Provider Notes (Signed)
[facility] Provider Note   CSN: [id number] Arrival date & time: 03/27/17  1802  By signing my name below, I, Phillips Climes, attest that this documentation has been prepared under the direction and in the presence of Deandrew Hoecker, PA-C. Electronically Signed: Phillips Climes, Scribe. 03/27/2017. 6:43 PM.    History   Chief Complaint Chief Complaint  Patient presents with  . Vaginal Discharge    HPI Comments Lisa Gibson is a 21 y.o. female with a PMHx significant for BV, who presents to the Emergency Department with complaints of suprapubic abdominal pain x1 day.  Associated thin white vaginal discharge.  No dysuria, increase in frequency or hematuria. Pt reports unprotected intercourse, but is not concerned about STDs at this time.  No recent ABX use.  Pt denies experiencing any other acute sx, including fevers, chills, nausea or vomiting.  She does not believe that she is pregnant.  The history is provided by the patient and medical records. No language interpreter was used.    History reviewed. No pertinent past medical history.  There are no active problems to display for this patient.   History reviewed. No pertinent surgical history.  OB History    No data available       Home Medications    Prior to Admission medications   Medication Sig Start Date End Date Taking? Authorizing Provider  metroNIDAZOLE (FLAGYL) 500 MG tablet Take 1 tablet (500 mg total) by mouth 2 (two) times daily. 12/13/16   Lavera Guise, MD  nitrofurantoin, macrocrystal-monohydrate, (MACROBID) 100 MG capsule Take 1 capsule (100 mg total) by mouth 2 (two) times daily. 12/13/16   Lavera Guise, MD    Family History History reviewed. No pertinent family history.  Social History Social History  Substance Use Topics  . Smoking status: Current Some Day Smoker    Packs/day: 0.50    Types: Cigarettes, Cigars  . Smokeless tobacco: Never Used  . Alcohol use Yes      Allergies   Patient has no known allergies.   Review of Systems Review of Systems  Constitutional: Negative for chills and fever.  Gastrointestinal: Positive for abdominal pain. Negative for nausea and vomiting.  Genitourinary: Positive for vaginal discharge. Negative for dysuria, frequency, hematuria, vaginal bleeding and vaginal pain.  All other systems reviewed and are negative.  Physical Exam Updated Vital Signs BP 124/88 (BP Location: Right Arm)   Pulse 89   Temp 98.5 F (36.9 C) (Oral)   Resp 18   Ht 5\' 4"  (1.626 m)   Wt 180 lb (81.6 kg)   LMP 03/20/2017   SpO2 100%   BMI 30.90 kg/m   Physical Exam  Constitutional: She is oriented to person, place, and time. She appears well-developed and well-nourished. No distress.  HENT:  Head: Normocephalic and atraumatic.  Eyes: Conjunctivae are normal.  Neck: Normal range of motion.  Pulmonary/Chest: Effort normal.  Abdominal: Soft. There is no tenderness.  Genitourinary:  Genitourinary Comments: RN chaperone present throughout entire exam. Normal external genitalia. Normal vaginal canal. Small thin white discharge. Cervix is normal, closed. No CMT. No uterine or adnexal tenderness. No masses palpated.    Musculoskeletal: Normal range of motion.  Neurological: She is alert and oriented to person, place, and time.  Skin: Skin is warm and dry.  Psychiatric: She has a normal mood and affect.  Nursing note and vitals reviewed.  ED Treatments / Results  DIAGNOSTIC STUDIES: Oxygen Saturation is 100% on room air, normal  by my interpretation.    COORDINATION OF CARE: 6:42 PM Discussed treatment plan with pt at bedside and pt agreed to plan.  Labs (all labs ordered are listed, but only abnormal results are displayed) Labs Reviewed  WET PREP, GENITAL  URINALYSIS, ROUTINE W REFLEX MICROSCOPIC  PREGNANCY, URINE  GC/CHLAMYDIA PROBE AMP ([place]) NOT [facility]    EKG  EKG Interpretation None       Radiology No  results found.  Procedures Procedures (including critical care time)  Medications Ordered in ED Medications - No data to display   Initial Impression / Assessment and Plan / ED Course  I have reviewed the triage vital signs and the nursing notes.  Pertinent labs & imaging results that were available during my care of the patient were reviewed by me and considered in my medical decision making (see chart for details).     Patient in emergency department vaginal discharge. History of BV, states feels the same. She does not use any products, douches, scented soaps O washes. She does however use bubble bath and takes hot baths. Advised to stop. Her exam is unremarkable, wet prep with clue cells, concerning for recurrent BV. We'll start on Flagyl. She's not concerned about STI and has no cervical motion tenderness, abdominal tenderness, uterine or adnexal tenderness. She is afebrile. We'll send cultures but hold off on treating for STI. Will have a follow-up with OB/GYN.  Vitals:   03/27/17 1815  BP: 124/88  Pulse: 89  Resp: 18  Temp: 98.5 F (36.9 C)  TempSrc: Oral  SpO2: 100%  Weight: 81.6 kg (180 lb)  Height: 5\' 4"  (1.626 m)     Final Clinical Impressions(s) / ED Diagnoses   Final diagnoses:  Bacterial vaginosis   I personally performed the services described in this documentation, which was scribed in my presence. The recorded information has been reviewed and is accurate.  New Prescriptions Discharge Medication List as of 03/27/2017  7:39 PM       Jaynie CrumbleKirichenko, Selene Peltzer, PA-C 03/28/17 0111    Alvira MondaySchlossman, Erin, MD 03/28/17 1311

## 2017-03-28 LAB — GC/CHLAMYDIA PROBE AMP (~~LOC~~) NOT AT ARMC
CHLAMYDIA, DNA PROBE: NEGATIVE
NEISSERIA GONORRHEA: NEGATIVE

## 2017-06-13 ENCOUNTER — Emergency Department (HOSPITAL_BASED_OUTPATIENT_CLINIC_OR_DEPARTMENT_OTHER)
Admission: EM | Admit: 2017-06-13 | Discharge: 2017-06-13 | Disposition: A | Discharge status: Home / Self Care | Payer: Self-pay | Attending: Emergency Medicine | Admitting: Emergency Medicine

## 2017-06-13 ENCOUNTER — Encounter (HOSPITAL_BASED_OUTPATIENT_CLINIC_OR_DEPARTMENT_OTHER): Payer: Self-pay | Admitting: Emergency Medicine

## 2017-06-13 DIAGNOSIS — N760 Acute vaginitis: Secondary | ICD-10-CM | POA: Insufficient documentation

## 2017-06-13 DIAGNOSIS — F17210 Nicotine dependence, cigarettes, uncomplicated: Secondary | ICD-10-CM | POA: Insufficient documentation

## 2017-06-13 DIAGNOSIS — K0889 Other specified disorders of teeth and supporting structures: Secondary | ICD-10-CM | POA: Insufficient documentation

## 2017-06-13 DIAGNOSIS — B9689 Other specified bacterial agents as the cause of diseases classified elsewhere: Secondary | ICD-10-CM

## 2017-06-13 LAB — URINALYSIS, ROUTINE W REFLEX MICROSCOPIC
Bilirubin Urine: NEGATIVE
GLUCOSE, UA: NEGATIVE mg/dL
Hgb urine dipstick: NEGATIVE
KETONES UR: NEGATIVE mg/dL
LEUKOCYTES UA: NEGATIVE
NITRITE: NEGATIVE
PROTEIN: NEGATIVE mg/dL
Specific Gravity, Urine: >1.03 — ABNORMAL HIGH (ref 1.005–1.030)
pH: 6 (ref 5.0–8.0)

## 2017-06-13 LAB — WET PREP, GENITAL
Sperm: NONE SEEN
Trich, Wet Prep: NONE SEEN
Yeast Wet Prep HPF POC: NONE SEEN

## 2017-06-13 LAB — PREGNANCY, URINE: Preg Test, Ur: NEGATIVE

## 2017-06-13 MED ORDER — METRONIDAZOLE 500 MG PO TABS: 500.0000 mg | ORAL_TABLET | Freq: Two times a day (BID) | ORAL | 0 refills | Status: DC

## 2017-06-13 MED FILL — metroNIDAZOLE 500 MG TABS: 500 | 7 days supply | Qty: 14 | Fill #0

## 2017-06-13 NOTE — ED Provider Notes (Signed)
[facility]   CSN: [id number] Arrival date & time: 06/13/17  4098     History   Chief Complaint Chief Complaint  Patient presents with  . Dental Pain  . Vaginal Discharge    HPI Lisa Gibson is a 21 y.o. female.  Patient is a 21 year old female who presents with vaginal discharge. She's noticed a discharge for about a week. She also has some lower abdominal cramping which started about 2-3 days ago. No nausea or vomiting. No urinary symptoms. She also complains of a one-day history of pain to her right lower molar. She feels like her wisdom tooth may be coming through. She took an Excedrin without improvement in symptoms.      No past medical history on file.  There are no active problems to display for this patient.   History reviewed. No pertinent surgical history.  OB History    No data available       Home Medications    Prior to Admission medications   Medication Sig Start Date End Date Taking? Authorizing Provider  metroNIDAZOLE (FLAGYL) 500 MG tablet Take 1 tablet (500 mg total) by mouth 2 (two) times daily. One po bid x 7 days 06/13/17   Rolan Bucco, MD    Family History No family history on file.  Social History Social History  Substance Use Topics  . Smoking status: Current Some Day Smoker    Packs/day: 0.50    Types: Cigarettes, Cigars  . Smokeless tobacco: Never Used  . Alcohol use Yes     Allergies   Patient has no known allergies.   Review of Systems Review of Systems  Constitutional: Negative for chills, diaphoresis, fatigue and fever.  HENT: Positive for dental problem. Negative for congestion, rhinorrhea and sneezing.   Eyes: Negative.   Respiratory: Negative for cough, chest tightness and shortness of breath.   Cardiovascular: Negative for chest pain and leg swelling.  Gastrointestinal: Negative for abdominal pain, blood in stool, diarrhea, nausea and vomiting.  Genitourinary: Positive for vaginal  discharge. Negative for difficulty urinating, flank pain, frequency, hematuria and vaginal bleeding.  Musculoskeletal: Negative for arthralgias and back pain.  Skin: Negative for rash.  Neurological: Negative for dizziness, speech difficulty, weakness, numbness and headaches.     Physical Exam Updated Vital Signs BP (!) 133/98 (BP Location: Right Arm)   Pulse 85   Temp 98.5 F (36.9 C) (Oral)   Resp 18   Ht  (1.651 m)   Wt 81.6 kg (180 lb)   SpO2 100%   BMI 29.95 kg/m   Physical Exam  Constitutional: She is oriented to person, place, and time. She appears well-developed and well-nourished.  HENT:  Head: Normocephalic and atraumatic.  There is tenderness along the right lower back molar. It does appear that the wisdom tooth is trying to push through the skin. There is no erythema, swelling or other signs of infection. No trismus.  Eyes: Pupils are equal, round, and reactive to light.  Neck: Normal range of motion. Neck supple.  Cardiovascular: Normal rate, regular rhythm and normal heart sounds.   Pulmonary/Chest: Effort normal and breath sounds normal. No respiratory distress. She has no wheezes. She has no rales. She exhibits no tenderness.  Abdominal: Soft. Bowel sounds are normal. There is no tenderness. There is no rebound and no guarding.  Genitourinary:  Genitourinary Comments: Positive tracheal discharge, no cervical motion tenderness or adnexal tenderness, no bleeding  Musculoskeletal: Normal range of motion. She exhibits no  edema.  Lymphadenopathy:    She has no cervical adenopathy.  Neurological: She is alert and oriented to person, place, and time.  Skin: Skin is warm and dry. No rash noted.  Psychiatric: She has a normal mood and affect.     ED Treatments / Results  Labs (all labs ordered are listed, but only abnormal results are displayed) Labs Reviewed  WET PREP, GENITAL - Abnormal; Notable for the following:       Result Value   Clue Cells Wet Prep  HPF POC PRESENT (*)    WBC, Wet Prep HPF POC MANY (*)    All other components within normal limits  URINALYSIS, ROUTINE W REFLEX MICROSCOPIC - Abnormal; Notable for the following:    Specific Gravity, Urine >1.030 (*)    All other components within normal limits  PREGNANCY, URINE  RPR  HIV ANTIBODY (ROUTINE TESTING)  GC/CHLAMYDIA PROBE AMP ([employer]) NOT AT [facility]    EKG  EKG Interpretation None       Radiology No results found.  Procedures Procedures (including critical care time)  Medications Ordered in ED Medications - No data to display   Initial Impression / Assessment and Plan / ED Course  I have reviewed the triage vital signs and the nursing notes.  Pertinent labs & imaging results that were available during my care of the patient were reviewed by me and considered in my medical decision making (see chart for details).     Patient presents with vaginal discharge. She is positive for bacterial vaginosis. She will be treated with Flagyl. STD testing was done as well and is pending. She does not want presumptive treatment for this. She also has some dental pain which is likely related to her wisdom tooth coming through. There is no overlying signs of infection. She was given a resource guide for possible outpatient dental follow-up. Return precautions were given.  Final Clinical Impressions(s) / ED Diagnoses   Final diagnoses:  BV (bacterial vaginosis)  Pain, dental    New Prescriptions New Prescriptions   METRONIDAZOLE (FLAGYL) 500 MG TABLET    Take 1 tablet (500 mg total) by mouth 2 (two) times daily. One po bid x 7 days     Rolan BuccoBelfi, Nathan Stallworth, MD 06/13/17 [phone]

## 2017-06-13 NOTE — ED Triage Notes (Signed)
Pt c/o right sided dental pain and vaginal discharge.

## 2017-06-14 LAB — GC/CHLAMYDIA PROBE AMP (~~LOC~~) NOT AT ARMC
CHLAMYDIA, DNA PROBE: NEGATIVE
Neisseria Gonorrhea: NEGATIVE

## 2017-06-14 LAB — RPR: RPR: NONREACTIVE

## 2017-06-14 LAB — HIV ANTIBODY (ROUTINE TESTING W REFLEX): HIV SCREEN 4TH GENERATION: NONREACTIVE

## 2020-03-11 ENCOUNTER — Emergency Department (HOSPITAL_BASED_OUTPATIENT_CLINIC_OR_DEPARTMENT_OTHER)
Admission: EM | Admit: 2020-03-11 | Discharge: 2020-03-12 | Disposition: A | Discharge status: Home / Self Care | Payer: Self-pay | Attending: Emergency Medicine | Admitting: Emergency Medicine

## 2020-03-11 ENCOUNTER — Other Ambulatory Visit: Payer: Self-pay

## 2020-03-11 ENCOUNTER — Encounter (HOSPITAL_BASED_OUTPATIENT_CLINIC_OR_DEPARTMENT_OTHER): Payer: Self-pay

## 2020-03-11 ENCOUNTER — Emergency Department (HOSPITAL_BASED_OUTPATIENT_CLINIC_OR_DEPARTMENT_OTHER): Payer: Self-pay

## 2020-03-11 DIAGNOSIS — R531 Weakness: Secondary | ICD-10-CM | POA: Insufficient documentation

## 2020-03-11 DIAGNOSIS — R1110 Vomiting, unspecified: Secondary | ICD-10-CM | POA: Insufficient documentation

## 2020-03-11 DIAGNOSIS — F17210 Nicotine dependence, cigarettes, uncomplicated: Secondary | ICD-10-CM | POA: Insufficient documentation

## 2020-03-11 DIAGNOSIS — R0789 Other chest pain: Secondary | ICD-10-CM | POA: Insufficient documentation

## 2020-03-11 DIAGNOSIS — I10 Essential (primary) hypertension: Secondary | ICD-10-CM | POA: Insufficient documentation

## 2020-03-11 HISTORY — DX: Essential (primary) hypertension: I10

## 2020-03-11 LAB — CBC
HCT: 40.3 % (ref 36.0–46.0)
Hemoglobin: 12.7 g/dL (ref 12.0–15.0)
MCH: 27.5 pg (ref 26.0–34.0)
MCHC: 31.5 g/dL (ref 30.0–36.0)
MCV: 87.4 fL (ref 80.0–100.0)
Platelets: 412 10*3/uL — ABNORMAL HIGH (ref 150–400)
RBC: 4.61 MIL/uL (ref 3.87–5.11)
RDW: 14.6 % (ref 11.5–15.5)
WBC: 7.4 10*3/uL (ref 4.0–10.5)
nRBC: 0 % (ref 0.0–0.2)

## 2020-03-11 LAB — BASIC METABOLIC PANEL
Anion gap: 12 (ref 5–15)
BUN: 7 mg/dL (ref 6–20)
CO2: 22 mmol/L (ref 22–32)
Calcium: 8.6 mg/dL — ABNORMAL LOW (ref 8.9–10.3)
Chloride: 105 mmol/L (ref 98–111)
Creatinine, Ser: 0.84 mg/dL (ref 0.44–1.00)
GFR calc Af Amer: >60 mL/min (ref >60)
GFR calc non Af Amer: >60 mL/min (ref >60)
Glucose, Bld: 111 mg/dL — ABNORMAL HIGH (ref 70–99)
Potassium: 3.5 mmol/L (ref 3.5–5.1)
Sodium: 139 mmol/L (ref 135–145)

## 2020-03-11 LAB — TROPONIN I (HIGH SENSITIVITY): Troponin I (High Sensitivity): 2 ng/L (ref <18)

## 2020-03-11 MED ORDER — SODIUM CHLORIDE 0.9% FLUSH
3.0000 mL | Freq: Once | INTRAVENOUS | Status: DC
  Filled 2020-03-11: qty 3

## 2020-03-11 MED ORDER — SODIUM CHLORIDE 0.9 % IV BOLUS
1000.0000 mL | Freq: Once | INTRAVENOUS | Status: AC
  Administered 2020-03-12: 1000 mL via INTRAVENOUS

## 2020-03-11 MED ORDER — ONDANSETRON HCL 4 MG/2ML IJ SOLN
4.0000 mg | Freq: Once | INTRAMUSCULAR | Status: AC
  Administered 2020-03-12: 4 mg via INTRAVENOUS
  Filled 2020-03-11: qty 2

## 2020-03-11 NOTE — ED Provider Notes (Signed)
[facility] EMERGENCY DEPARTMENT Provider Note   CSN: [id number] Arrival date & time: 03/11/20  2214   History Chief Complaint  Patient presents with  . Shortness of Breath  . Chest Pain    Lisa Gibson is a 24 y.o. female.  The history is provided by the patient.  Shortness of Breath Associated symptoms: chest pain   Chest Pain Associated symptoms: shortness of breath   She complains of chest pain for about the last hour with some associated shortness of breath.  She vomited after arriving in the ED.  Pain is described as a pressure.  Nothing makes it better, nothing makes it worse.  She had not felt well through the day.  She had a headache this morning and took Excedrin 2 times during the day.  Headache has resolved.  Currently, she is complaining of feeling generally weak.  She denies fever or chills.  She denies cough.  Denies diaphoresis.  She is a cigarette smoker and has history of hypertension but no history of diabetes or hyperlipidemia.  She has not been taking any exogenous estrogens.  Past Medical History:  Diagnosis Date  . Hypertension     There are no problems to display for this patient.   History reviewed. No pertinent surgical history.   OB History   No obstetric history on file.     No family history on file.  Social History   Tobacco Use  . Smoking status: Current Some Day Smoker    Packs/day: 0.50    Types: Cigarettes  . Smokeless tobacco: Never Used  Substance Use Topics  . Alcohol use: Yes    Comment: weekly  . Drug use: No    Home Medications Prior to Admission medications   Medication Sig Start Date End Date Taking? Authorizing Provider  metroNIDAZOLE (FLAGYL) 500 MG tablet Take 1 tablet (500 mg total) by mouth 2 (two) times daily. One po bid x 7 days 06/13/17   Rolan Bucco, MD  ranitidine (ZANTAC) 150 MG tablet Take 1 tablet (150 mg total) by mouth 2 (two) times daily. 11/28/12 10/13/14  Marcellina Millin, MD    Allergies     Patient has no known allergies.  Review of Systems   Review of Systems  Respiratory: Positive for shortness of breath.   Cardiovascular: Positive for chest pain.  All other systems reviewed and are negative.   Physical Exam Updated Vital Signs BP (!) 148/108 (BP Location: Left Arm)   Pulse (!) 117   Temp 99.1 F (37.3 C) (Oral)   Resp 20   Ht 5\' 3"  (1.6 m)   Wt 91.2 kg   LMP 03/11/2020   SpO2 100%   BMI 35.61 kg/m   Physical Exam Vitals and nursing note reviewed.   24 year old female, resting comfortably and in no acute distress. Vital signs are significant for elevated blood pressure and heart rate. Oxygen saturation is 100%, which is normal. Head is normocephalic and atraumatic. PERRLA, EOMI. Oropharynx is clear. Neck is nontender and supple without adenopathy or JVD. Back is nontender and there is no CVA tenderness. Lungs are clear without rales, wheezes, or rhonchi. Chest is nontender. Heart has regular rate and rhythm without murmur. Abdomen is soft, flat, nontender without masses or hepatosplenomegaly and peristalsis is normoactive. Extremities have no cyanosis or edema, full range of motion is present. Skin is warm and dry without rash. Neurologic: Mental status is normal, cranial nerves are intact, there are no motor or sensory deficits.  ED Results / Procedures / Treatments   Labs (all labs ordered are listed, but only abnormal results are displayed) Labs Reviewed  BASIC METABOLIC PANEL - Abnormal; Notable for the following components:      Result Value   Glucose, Bld 111 (*)    Calcium 8.6 (*)    All other components within normal limits  CBC - Abnormal; Notable for the following components:   Platelets 412 (*)    All other components within normal limits  PREGNANCY, URINE  TROPONIN I (HIGH SENSITIVITY)  TROPONIN I (HIGH SENSITIVITY)    EKG EKG Interpretation  Date/Time:  Friday March 11 2020 22:28:20 EDT Ventricular Rate:  116 PR  Interval:  126 QRS Duration: 70 QT Interval:  338 QTC Calculation: 469 R Axis:   77 Text Interpretation: Sinus tachycardia Nonspecific T wave abnormality Abnormal ECG tachycardia new from previous Confirmed by Theotis Burrow [phone]) on 03/11/2020 10:45:38 PM   Radiology DG Chest Portable 1 View  Result Date: 03/11/2020 CLINICAL DATA:  Shortness of breath, chest pain EXAM: PORTABLE CHEST 1 VIEW COMPARISON:  11/28/2012 FINDINGS: The heart size and mediastinal contours are within normal limits. Both lungs are clear. The visualized skeletal structures are unremarkable. IMPRESSION: No active disease. Electronically Signed   By: Rolm Baptise M.D.   On: 03/11/2020 23:19    Procedures Procedures   Medications Ordered in ED Medications  sodium chloride flush (NS) 0.9 % injection 3 mL (has no administration in time range)    ED Course  I have reviewed the triage vital signs and the nursing notes.  Pertinent labs & imaging results that were available during my care of the patient were reviewed by me and considered in my medical decision making (see chart for details).  MDM Rules/Calculators/A&P Chest pain of uncertain cause.  Doubt cardiac etiology.  ECG has some minor nonspecific T wave changes, chest x-ray is normal.  Labs are unremarkable including normal troponin.  Old records are reviewed, and she has no relevant past visits.  She will be given IV fluids and ondansetron and reassessed.  Repeat troponin is normal.  She feels much better after above-noted treatment.  Blood pressure continues to be elevated throughout her ED stay.  She is discharged with instructions to monitor her blood pressure as an outpatient, advised that she may need to be on medication if blood pressure does not come down spontaneously.  Return precautions discussed.  Final Clinical Impression(s) / ED Diagnoses Final diagnoses:  Atypical chest pain  Elevated blood pressure reading with diagnosis of hypertension    Rx  / DC Orders ED Discharge Orders    None       Delora Fuel, MD 30/86/57 0206

## 2020-03-11 NOTE — ED Triage Notes (Addendum)
Pt c/o SOB x 20 min-pt vomited x 1 in ED BR upon arrival-denies n/v PTA-states she has taken 4 excedrin today for HA that started this am-NAD-steady gait-pt later added she has been having chest tightness x 1-2 weeks-was seen at [facility] ED recently for same c/o

## 2020-03-12 LAB — TROPONIN I (HIGH SENSITIVITY): Troponin I (High Sensitivity): 3 ng/L

## 2020-03-12 LAB — PREGNANCY, URINE: Preg Test, Ur: NEGATIVE

## 2020-03-12 NOTE — ED Notes (Signed)
Pt tolerated sips of po. No emesis

## 2020-03-12 NOTE — Discharge Instructions (Signed)
Please monitor your blood pressure at home.  Take it once a day for the next 7 days.  If it stays consistently elevated, you will need to be started on medication to lower it.  Be aware that high blood pressure does not cause symptoms, but inadequately treated high blood pressure can lead to heart attacks, strokes, kidney failure.

## 2020-03-12 NOTE — ED Notes (Signed)
Pt states " I want to eat, why do I need and IV" Pt with abrasive tone. Informed pt these are MD recommendations for care. Pt can refuse any form of treatment. Pt states she will get IV, but wants to know when she can eat.

## 2020-04-15 ENCOUNTER — Encounter (HOSPITAL_COMMUNITY): Payer: Self-pay

## 2020-04-15 ENCOUNTER — Emergency Department (HOSPITAL_COMMUNITY): Payer: 59

## 2020-04-15 ENCOUNTER — Emergency Department (HOSPITAL_COMMUNITY)
Admission: EM | Admit: 2020-04-15 | Discharge: 2020-04-15 | Disposition: A | Discharge status: Home / Self Care | Payer: 59 | Attending: Emergency Medicine | Admitting: Emergency Medicine

## 2020-04-15 ENCOUNTER — Other Ambulatory Visit: Payer: Self-pay

## 2020-04-15 DIAGNOSIS — R030 Elevated blood-pressure reading, without diagnosis of hypertension: Secondary | ICD-10-CM

## 2020-04-15 DIAGNOSIS — R0602 Shortness of breath: Secondary | ICD-10-CM | POA: Diagnosis not present

## 2020-04-15 DIAGNOSIS — R112 Nausea with vomiting, unspecified: Secondary | ICD-10-CM | POA: Diagnosis not present

## 2020-04-15 DIAGNOSIS — R0789 Other chest pain: Secondary | ICD-10-CM | POA: Diagnosis present

## 2020-04-15 DIAGNOSIS — F17210 Nicotine dependence, cigarettes, uncomplicated: Secondary | ICD-10-CM | POA: Diagnosis not present

## 2020-04-15 DIAGNOSIS — I10 Essential (primary) hypertension: Secondary | ICD-10-CM | POA: Insufficient documentation

## 2020-04-15 LAB — CBC
HCT: 38.4 % (ref 36.0–46.0)
Hemoglobin: 12.1 g/dL (ref 12.0–15.0)
MCH: 27.4 pg (ref 26.0–34.0)
MCHC: 31.5 g/dL (ref 30.0–36.0)
MCV: 87.1 fL (ref 80.0–100.0)
Platelets: 383 10*3/uL (ref 150–400)
RBC: 4.41 MIL/uL (ref 3.87–5.11)
RDW: 14.3 % (ref 11.5–15.5)
WBC: 7.2 10*3/uL (ref 4.0–10.5)
nRBC: 0 % (ref 0.0–0.2)

## 2020-04-15 LAB — D-DIMER, QUANTITATIVE: D-Dimer, Quant: 0.4 ug/mL-FEU (ref 0.00–0.50)

## 2020-04-15 LAB — BASIC METABOLIC PANEL
Anion gap: 9 (ref 5–15)
BUN: 10 mg/dL (ref 6–20)
CO2: 25 mmol/L (ref 22–32)
Calcium: 9.2 mg/dL (ref 8.9–10.3)
Chloride: 104 mmol/L (ref 98–111)
Creatinine, Ser: 0.92 mg/dL (ref 0.44–1.00)
GFR calc Af Amer: >60 mL/min (ref >60)
GFR calc non Af Amer: >60 mL/min (ref >60)
Glucose, Bld: 107 mg/dL — ABNORMAL HIGH (ref 70–99)
Potassium: 4 mmol/L (ref 3.5–5.1)
Sodium: 138 mmol/L (ref 135–145)

## 2020-04-15 LAB — HEPATIC FUNCTION PANEL
ALT: 20 U/L (ref 0–44)
AST: 19 U/L (ref 15–41)
Albumin: 4.2 g/dL (ref 3.5–5.0)
Alkaline Phosphatase: 46 U/L (ref 38–126)
Bilirubin, Direct: <0.1 mg/dL (ref 0.0–0.2)
Total Bilirubin: 0.6 mg/dL (ref 0.3–1.2)
Total Protein: 7.5 g/dL (ref 6.5–8.1)

## 2020-04-15 LAB — I-STAT BETA HCG BLOOD, ED (MC, WL, AP ONLY): I-stat hCG, quantitative: <5 m[IU]/mL (ref <5)

## 2020-04-15 LAB — TROPONIN I (HIGH SENSITIVITY)
Troponin I (High Sensitivity): 3 ng/L (ref <18)
Troponin I (High Sensitivity): 3 ng/L (ref <18)

## 2020-04-15 LAB — LIPASE, BLOOD: Lipase: 44 U/L (ref 11–51)

## 2020-04-15 MED ORDER — SODIUM CHLORIDE (PF) 0.9 % IJ SOLN
INTRAMUSCULAR | Status: AC
  Filled 2020-04-15: qty 50

## 2020-04-15 MED ORDER — KETOROLAC TROMETHAMINE 30 MG/ML IJ SOLN
30.0000 mg | Freq: Once | INTRAMUSCULAR | Status: AC
  Administered 2020-04-15: 30 mg via INTRAVENOUS
  Filled 2020-04-15: qty 1

## 2020-04-15 MED ORDER — LIDOCAINE VISCOUS HCL 2 % MT SOLN
15.0000 mL | Freq: Once | OROMUCOSAL | Status: AC
  Administered 2020-04-15: 15 mL via ORAL
  Filled 2020-04-15: qty 15

## 2020-04-15 MED ORDER — OMEPRAZOLE 20 MG PO CPDR: 20.0000 mg | DELAYED_RELEASE_CAPSULE | Freq: Every day | ORAL | 0 refills | Status: AC

## 2020-04-15 MED ORDER — IOHEXOL 350 MG/ML SOLN
100.0000 mL | Freq: Once | INTRAVENOUS | Status: AC | PRN
  Administered 2020-04-15: 100 mL via INTRAVENOUS

## 2020-04-15 MED ORDER — ALUM & MAG HYDROXIDE-SIMETH 200-200-20 MG/5ML PO SUSP
30.0000 mL | Freq: Once | ORAL | Status: AC
  Administered 2020-04-15: 30 mL via ORAL
  Filled 2020-04-15: qty 30

## 2020-04-15 MED ORDER — AMLODIPINE BESYLATE 5 MG PO TABS
5.0000 mg | ORAL_TABLET | Freq: Every day | ORAL | 0 refills | Status: AC
Start: 2020-04-15 — End: ?

## 2020-04-15 NOTE — ED Triage Notes (Signed)
Pt sts she has been having shob/ chest pain for several days. Seen at [facility] yesterday for same symptoms.

## 2020-04-15 NOTE — ED Provider Notes (Signed)
[facility]-EMERGENCY DEPT Provider Note   CSN: [id number] Arrival date & time: 04/15/20  0348     History Chief Complaint  Patient presents with  . Chest Pain    Lisa Gibson is a 24 y.o. female.  Patient with history of hypertension here with left-sided chest pain and pressure for the past 3 days.  She describes a pressure in the low side of her chest that radiates to her left arm and left back.  Last for several minutes at a time.  It is somewhat worse with palpation and movement.  Is associated with shortness of breath.  No cough or fever.  She was seen for similar pain at an outside hospital".  She was also treated for UTI which she has not yet started the antibiotics on.  She has had intermittent nausea and vomiting for the past several days but none in the past 24 hours.  Denies any abdominal pain.  Denies any diarrhea.  Denies any vaginal bleeding or discharge.  Denies any cardiac history but she is a smoker. She has not been on blood pressure medication for many years.  She has no history of CAD. Suspect her anxiety is playing a role in her chest pain.  She does not have any chest pain currently.  She is nonsurgical makes it better or worse.  States she was having the pain before the vomiting.  The history is provided by the patient.  Chest Pain Associated symptoms: nausea, shortness of breath and vomiting   Associated symptoms: no abdominal pain, no cough, no dizziness, no fatigue, no fever, no headache and no weakness        Past Medical History:  Diagnosis Date  . Hypertension     There are no problems to display for this patient.   History reviewed. No pertinent surgical history.   OB History   No obstetric history on file.     No family history on file.  Social History   Tobacco Use  . Smoking status: Current Some Day Smoker    Packs/day: 0.50    Types: Cigarettes  . Smokeless tobacco: Never Used  Vaping Use  . Vaping Use: Never  used  Substance Use Topics  . Alcohol use: Yes    Comment: weekly  . Drug use: No    Home Medications Prior to Admission medications   Medication Sig Start Date End Date Taking? Authorizing Provider  ranitidine (ZANTAC) 150 MG tablet Take 1 tablet (150 mg total) by mouth 2 (two) times daily. 11/28/12 10/13/14  Marcellina Millin, MD    Allergies    Patient has no known allergies.  Review of Systems   Review of Systems  Constitutional: Negative for activity change, appetite change, fatigue and fever.  HENT: Negative for congestion and rhinorrhea.   Eyes: Negative for visual disturbance.  Respiratory: Positive for chest tightness and shortness of breath. Negative for cough.   Cardiovascular: Positive for chest pain.  Gastrointestinal: Positive for nausea and vomiting. Negative for abdominal pain.  Genitourinary: Negative for dysuria and hematuria.  Musculoskeletal: Negative for arthralgias and myalgias.  Skin: Negative for rash.  Neurological: Negative for dizziness, weakness, light-headedness and headaches.   all other systems are negative except as noted in the HPI and PMH.    Physical Exam Updated Vital Signs BP (!) 142/116 (BP Location: Right Arm)   Pulse 91   Temp 98.3 F (36.8 C) (Oral)   Resp 20   Ht 5\' 4"  (1.626 m)  Wt 93.9 kg   SpO2 97%   BMI 35.53 kg/m   Physical Exam Vitals and nursing note reviewed.  Constitutional:      General: She is not in acute distress.    Appearance: She is well-developed.  HENT:     Head: Normocephalic and atraumatic.     Mouth/Throat:     Pharynx: No oropharyngeal exudate.  Eyes:     Conjunctiva/sclera: Conjunctivae normal.     Pupils: Pupils are equal, round, and reactive to light.  Neck:     Comments: No meningismus. Cardiovascular:     Rate and Rhythm: Normal rate and regular rhythm.     Heart sounds: Normal heart sounds. No murmur heard.   Pulmonary:     Effort: Pulmonary effort is normal. No respiratory distress.      Breath sounds: Normal breath sounds.     Comments: Reproducible left-sided tenderness. Chest:     Chest wall: Tenderness present.  Abdominal:     Palpations: Abdomen is soft.     Tenderness: There is no abdominal tenderness. There is no guarding or rebound.  Musculoskeletal:        General: No tenderness. Normal range of motion.     Cervical back: Normal range of motion and neck supple.  Skin:    General: Skin is warm.  Neurological:     Mental Status: She is alert and oriented to person, place, and time.     Cranial Nerves: No cranial nerve deficit.     Motor: No abnormal muscle tone.     Coordination: Coordination normal.     Comments:  5/5 strength throughout. CN 2-12 intact.Equal grip strength.   Psychiatric:        Behavior: Behavior normal.     ED Results / Procedures / Treatments   Labs (all labs ordered are listed, but only abnormal results are displayed) Labs Reviewed  BASIC METABOLIC PANEL - Abnormal; Notable for the following components:      Result Value   Glucose, Bld 107 (*)    All other components within normal limits  CBC  D-DIMER, QUANTITATIVE (NOT AT [facility])  HEPATIC FUNCTION PANEL  LIPASE, BLOOD  I-STAT BETA HCG BLOOD, ED ([facility], [facility], [facility] ONLY)  TROPONIN I (HIGH SENSITIVITY)  TROPONIN I (HIGH SENSITIVITY)    EKG EKG Interpretation  Date/Time:  Friday April 15 2020 03:57:30 EDT Ventricular Rate:  93 PR Interval:    QRS Duration: 76 QT Interval:  355 QTC Calculation: 442 R Axis:   53 Text Interpretation: Sinus rhythm 12 Lead; Mason-Likar No significant change was found Confirmed by Glynn Octaveancour, Tabitha Riggins [phone]([id number]) on 04/15/2020 4:31:37 AM   Radiology DG Chest 2 View  Result Date: 04/15/2020 CLINICAL DATA:  Chest pain and pressure for 3 days EXAM: CHEST - 2 VIEW COMPARISON:  11/28/2012 FINDINGS: Artifact from EKG leads. Normal heart size and mediastinal contours. No acute infiltrate or edema. No effusion or pneumothorax. No acute osseous findings. IMPRESSION: No  active cardiopulmonary disease. Electronically Signed   By: Marnee SpringJonathon  Watts M.D.   On: 04/15/2020 05:00   CT Angio Chest/Abd/Pel for Dissection W and/or Wo Contrast  Result Date: 04/15/2020 CLINICAL DATA:  Chest pain and shortness of breath EXAM: CT ANGIOGRAPHY CHEST, ABDOMEN AND PELVIS TECHNIQUE: Non-contrast CT of the chest was initially obtained. Multidetector CT imaging through the chest, abdomen and pelvis was performed using the standard protocol during bolus administration of intravenous contrast. Multiplanar reconstructed images and MIPs were obtained and reviewed to evaluate the vascular anatomy.  CONTRAST:  OMNIPAQUE IOHEXOL 350 MG/ML SOLN COMPARISON:  CT abdomen and pelvis March 31, 2013. Chest radiograph April 15, 2020 FINDINGS: CTA CHEST FINDINGS Cardiovascular: There is no appreciable intramural hematoma on noncontrast enhanced study. There is no appreciable thoracic aortic aneurysm or dissection. The visualized great vessels appear unremarkable. No mucosal irregularities noted in the thoracic aorta or great vessels. No pulmonary embolus is demonstrable. There is no pericardial effusion or pericardial thickening. There is trace coronary artery calcification. Mediastinum/Nodes: Thyroid appears normal. No evident thoracic adenopathy. No esophageal lesions are appreciable. No pneumomediastinum. Lungs/Pleura: No evident pneumothorax. The lungs are clear. No pleural effusions are evident. Musculoskeletal: There are no blastic or lytic bone lesions. There are no appreciable chest wall lesions. Review of the MIP images confirms the above findings. CTA ABDOMEN AND PELVIS FINDINGS VASCULAR Aorta: No abdominal aortic aneurysm or dissection. No mucosal lesions evident in the aorta. Celiac: Celiac artery and its branches are widely patent without appreciable atherosclerotic plaque. No aneurysm or dissection involving these vessels. SMA: Superior mesenteric artery and its branches are widely patent. No  aneurysm or dissection involving these vessels. Renals: There is a single renal artery bilaterally. Renal arteries and their respective branches are widely patent. There is no appreciable fibromuscular dysplasia. IMA: Inferior mesenteric artery and its branches are patent. No aneurysm or dissection involving these vessels. Inflow: Pelvic arterial vessels are widely patent throughout their respective courses without appreciable atherosclerotic plaque. No aneurysm or dissection involving these vessels. Proximal profunda femoral and superficial femoral arteries widely patent. Veins: No obvious venous abnormality within the limitations of this arterial phase study. Retroaortic left renal vein noted incidentally. Review of the MIP images confirms the above findings. NON-VASCULAR Hepatobiliary: No focal liver lesions are appreciable. Gallbladder wall is not appreciably thickened. There is no biliary duct dilatation. Pancreas: No pancreatic mass or inflammatory focus. Spleen: No splenic lesions are evident. Adrenals/Urinary Tract: Adrenals bilaterally appear normal. Kidneys bilaterally show no evident mass or hydronephrosis on either side. No intrarenal calculi are noted. Note that contrast in the collecting systems could potentially mask small calculi. There is no evident ureteral calculus on either side. Urinary bladder is midline with wall thickness within normal limits. Stomach/Bowel: There is no appreciable bowel wall or mesenteric thickening. No evident bowel obstruction. The terminal ileum appears normal. There is no evident free air or portal venous air. Lymphatic: No evident adenopathy in the abdomen or pelvis. Reproductive: Uterus is anteverted.  No pelvic mass evident. Other: Appendix appears normal. No evident abscess or ascites in the abdomen or pelvis. There is a small umbilical hernia containing only fat. Musculoskeletal: No blastic or lytic bone lesions. No intramuscular lesions are evident. Review of the  MIP images confirms the above findings. IMPRESSION: CT angiogram chest: 1. No thoracic aortic aneurysm or dissection. Trace coronary artery calcification noted. 2.  No evident pulmonary embolus. 3.  Lungs clear. 4.  No evident thoracic adenopathy. CT angiogram abdomen; CT angiogram pelvis: 1. There is no aneurysm or dissection involving the aorta, major mesenteric, and major pelvic arterial vessels. No fibromuscular dysplasia. No appreciable atherosclerotic plaque in these vessels. 2. No bowel wall thickening or bowel obstruction. No abscess in the abdomen or pelvis. Appendix appears normal. 3. No hydronephrosis. No renal or ureteral calculi evident. Note that contrast in the renal collecting systems could potentially mask small calculi. Urinary bladder wall thickness normal. 4.  Small umbilical hernia containing only fat. Electronically Signed   By: Bretta Bang III M.D.   On:  04/15/2020 08:00    Procedures Procedures (including critical care time)  Medications Ordered in ED Medications  alum & mag hydroxide-simeth (MAALOX/MYLANTA) 200-200-20 MG/5ML suspension 30 mL (has no administration in time range)    And  lidocaine (XYLOCAINE) 2 % viscous mouth solution 15 mL (has no administration in time range)    ED Course  I have reviewed the triage vital signs and the nursing notes.  Pertinent labs & imaging results that were available during my care of the patient were reviewed by me and considered in my medical decision making (see chart for details).    MDM Rules/Calculators/A&P                         Intermittent chest pain for the past 3 days, not known.  Recent evaluation for same findings without acute ST changes.  Pain somewhat reproducible, atypical for ACS. D-dimer negative. Doubt PE. LFTs and lipase normal.   BP elevated. Dx with hypertension in past but never started meds she was given. Equal upper extremity blood pressures.  Troponin negative x2. No evidence of aortic  dissection, PE or other acute pathology on CT. Start empiric PPI. Avoid alcohol, caffeine, NSAIDs, spicy foods.  Restart BP meds. D/w patient possibility of long term problems from high blood pressure. Establish care with PCP>  Return to the ED if chest pain becomes exertional, associated with SOB, vomiting, diaphoresis or any other concerns.  Final Clinical Impression(s) / ED Diagnoses Final diagnoses:  Atypical chest pain  Elevated blood pressure reading    Rx / DC Orders ED Discharge Orders    None       Maudean Hoffmann, Jeannett Senior, MD 04/15/20 [phone]

## 2020-04-15 NOTE — Discharge Instructions (Signed)
Your testing is negative for heart attack or blood clot in the lung.  Take the stomach medication as prescribed and avoid alcohol, caffeine, NSAID medications and spicy foods.  Her blood pressure is also elevated you should follow-up with your doctor regarding this.  Elevated blood pressure can cause long-term damage to your brain, eyes, heart, lungs and kidneys.  Return to the ED if your chest pain becomes exertional, associated with shortness of breath, nausea, vomiting, any other concerns.

## 2020-10-06 IMAGING — CR DG CHEST 2V
2 series · 2 of 2 positions shown · non-contrast
Comparison: 11/28/2012

CLINICAL DATA: Chest pain and pressure for 3 days

EXAM:
CHEST - 2 VIEW

[w chest lat]
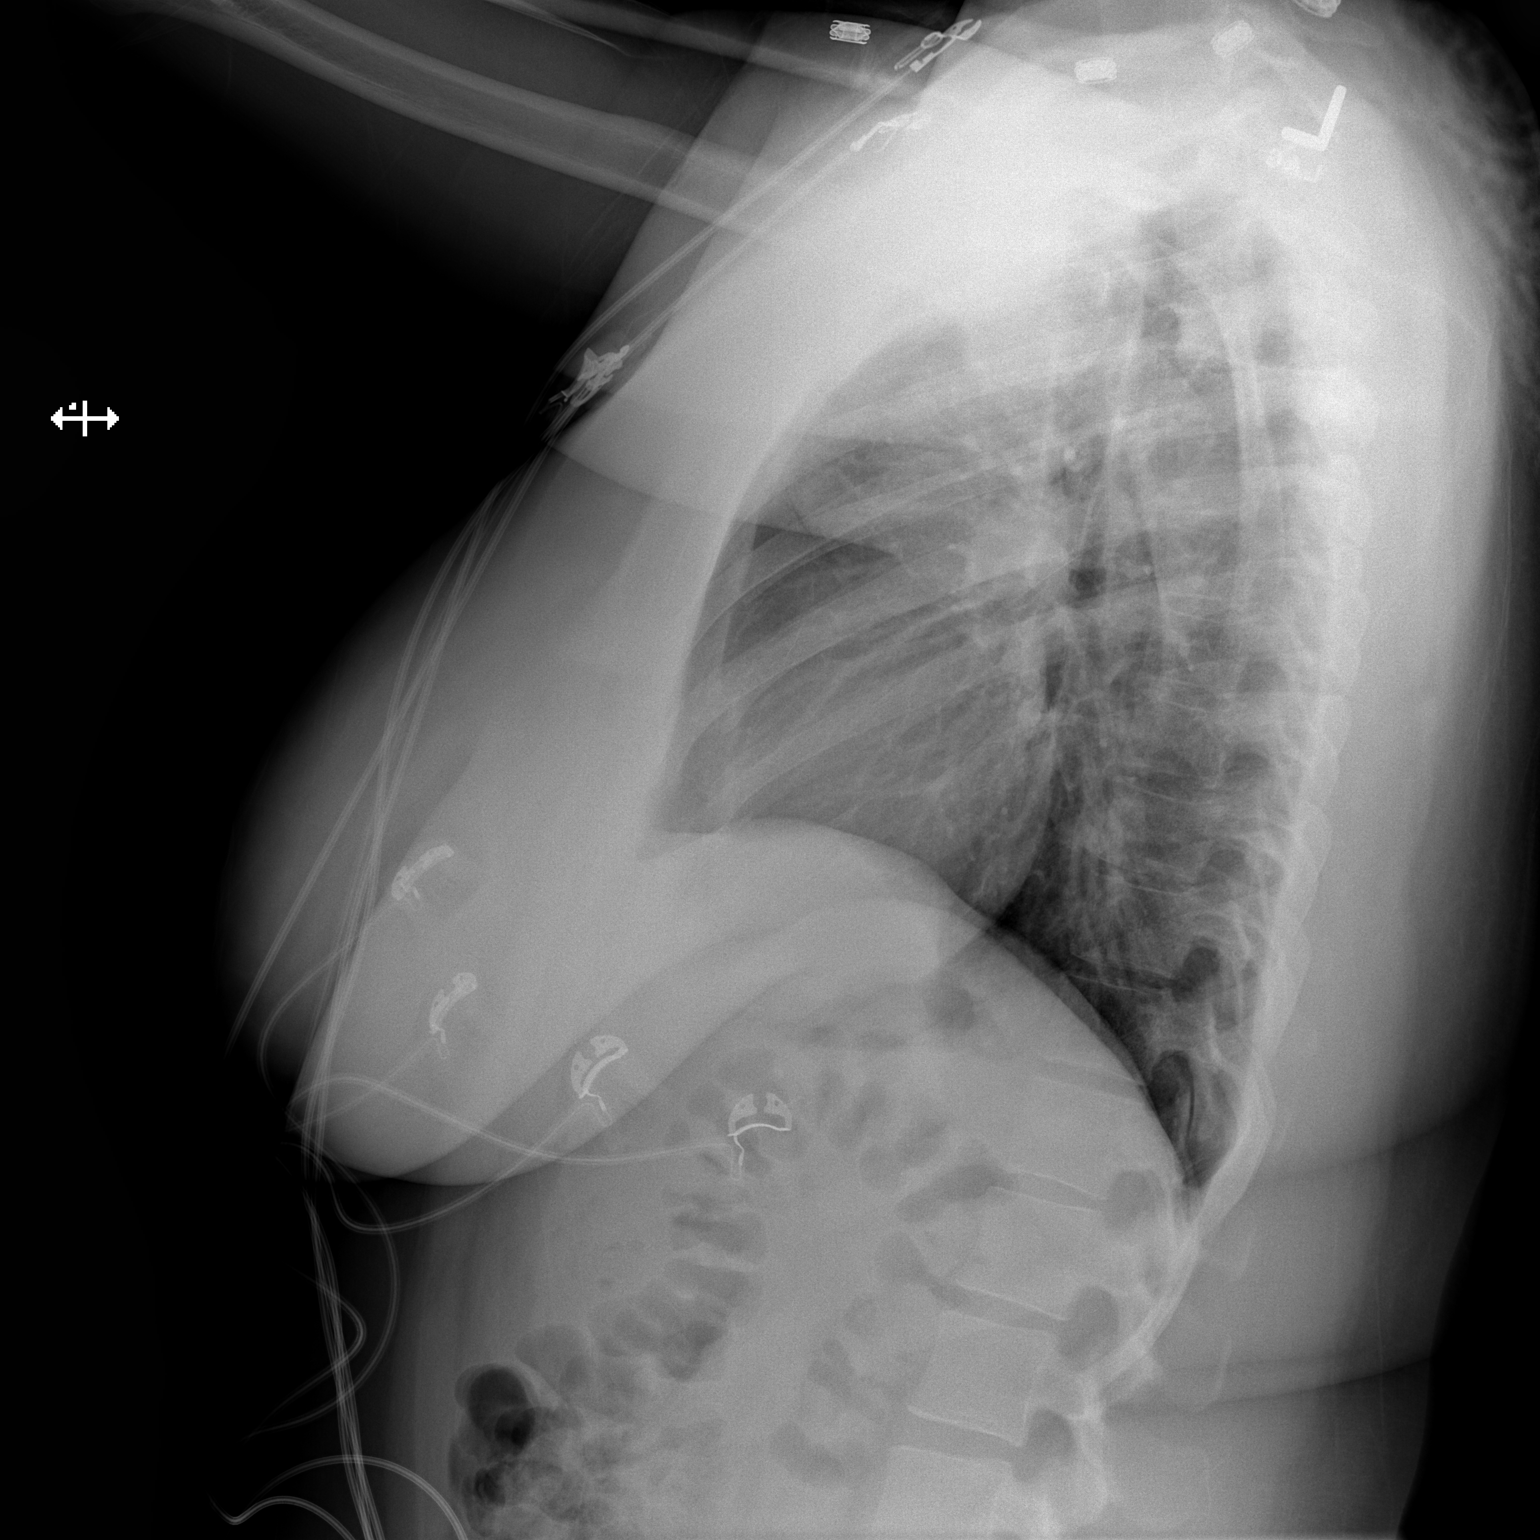

[w chest pa]
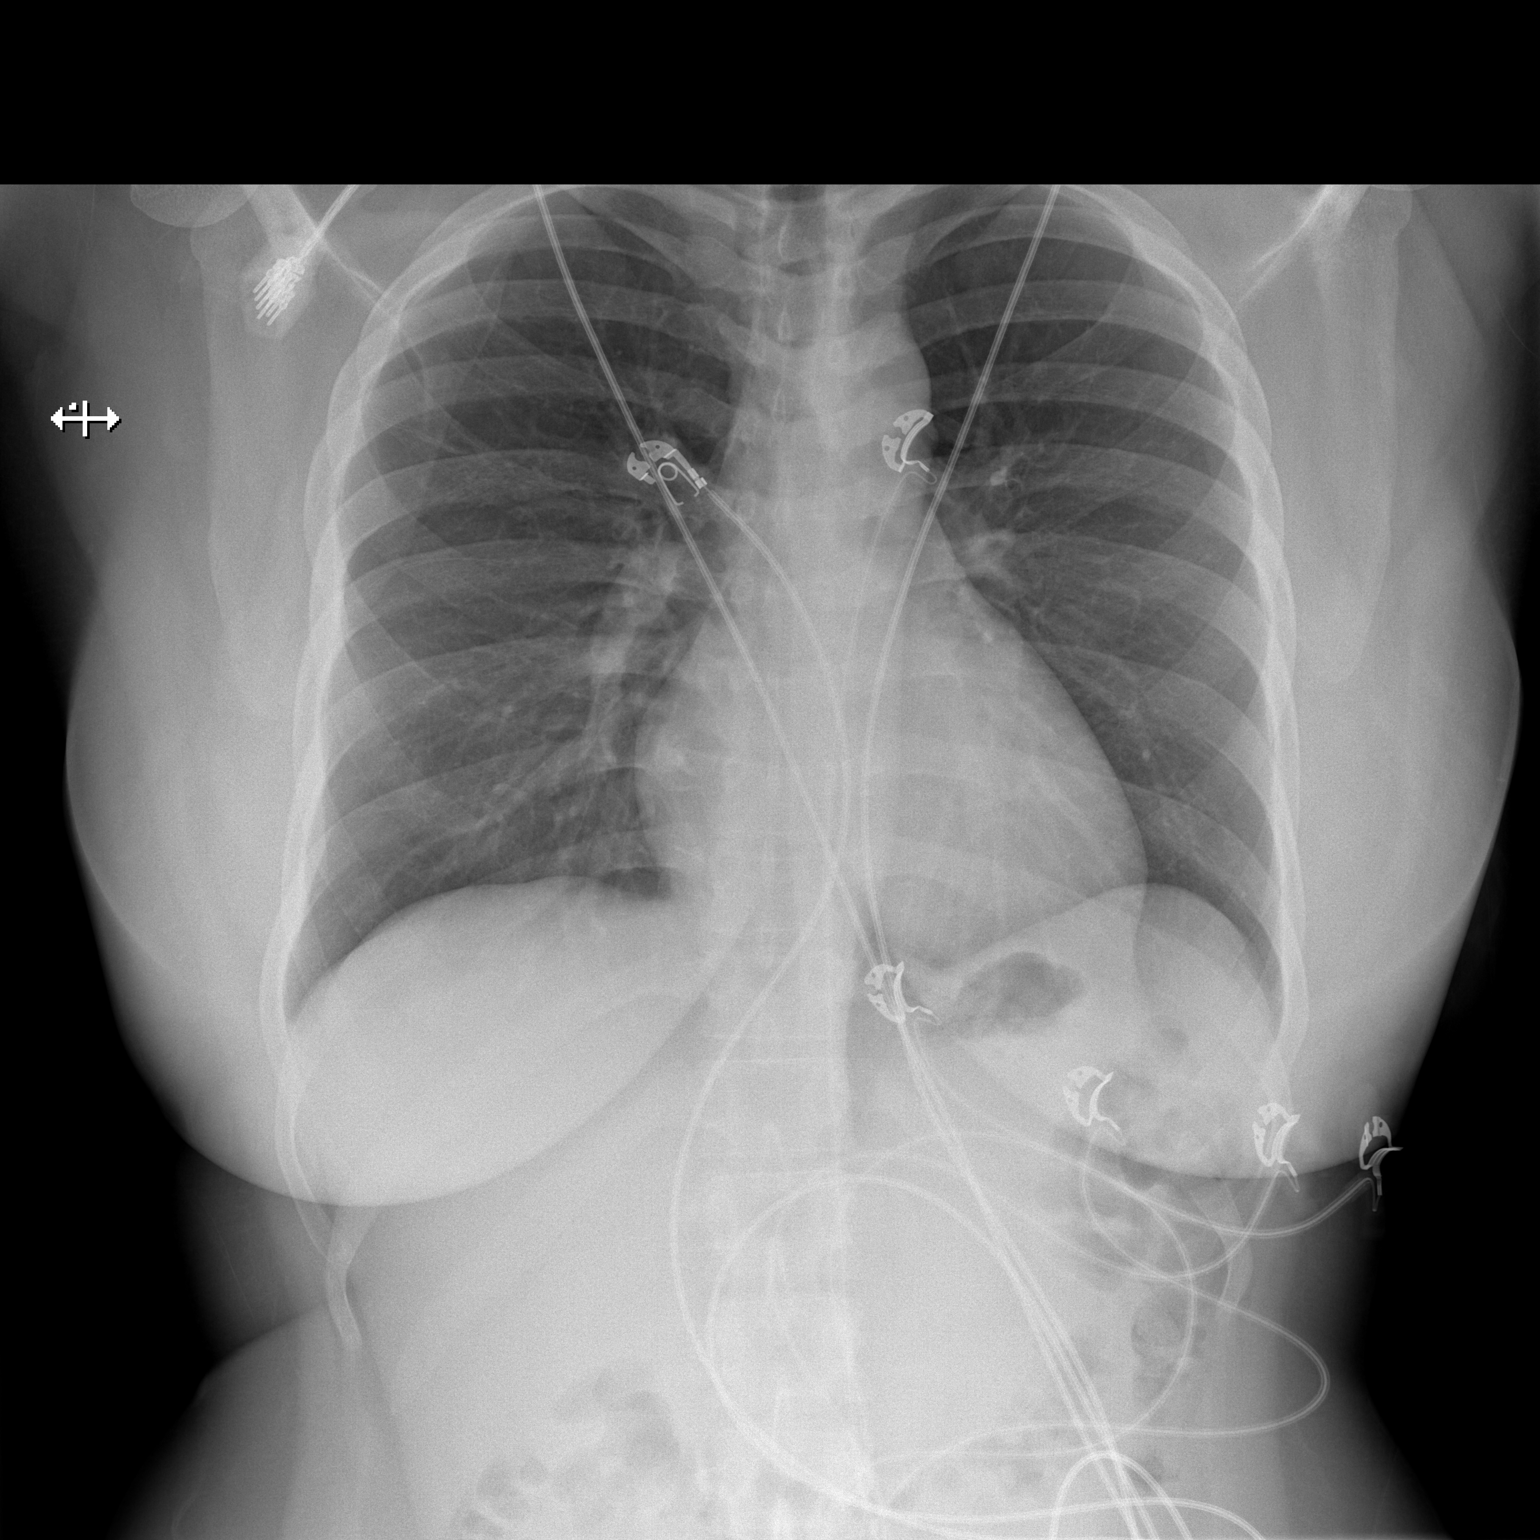

[2 of 2 positions shown; findings below may reference images not displayed]

FINDINGS: Artifact from EKG leads.

Normal heart size and mediastinal contours. No acute infiltrate or
edema. No effusion or pneumothorax. No acute osseous findings.
IMPRESSION: No active cardiopulmonary disease.

## 2020-10-06 IMAGING — CT CT ANGIO CHEST-ABD-PELV FOR DISSECTION W/ AND WO/W CM
2 of 7 series · 13 of 46 positions shown, 15 images · IV contrast (OMNIPAQUE 350)
Comparison: CT abdomen and pelvis March 31, 2013. Chest radiograph
April 15, 2020

CLINICAL DATA: Chest pain and shortness of breath

EXAM:
CT ANGIOGRAPHY CHEST, ABDOMEN AND PELVIS
TECHNIQUE: Non-contrast CT of the chest was initially obtained.

[Series 6: axial arterial · axial · arterial · 0.68mm/px · z∈[-545,-38]mm · 10 of 193 slices shown, 12 images]
[im 12/193  soft-tissue]
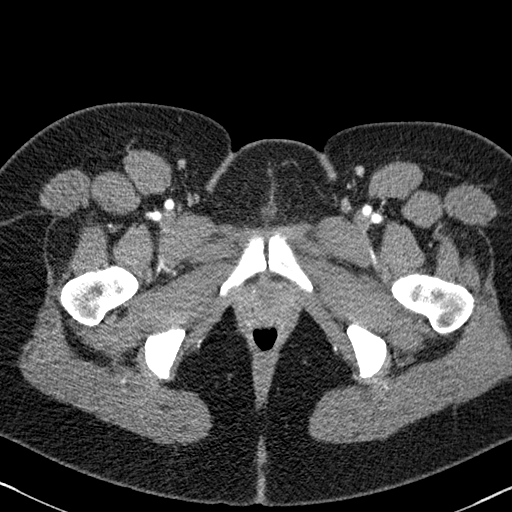
[im 12/193  bone]
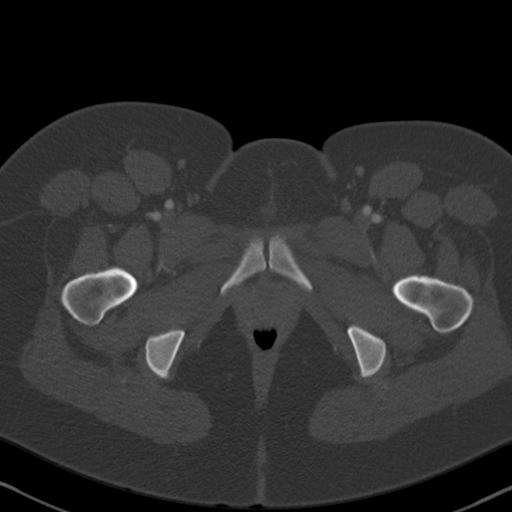
[im 34/193  soft-tissue]
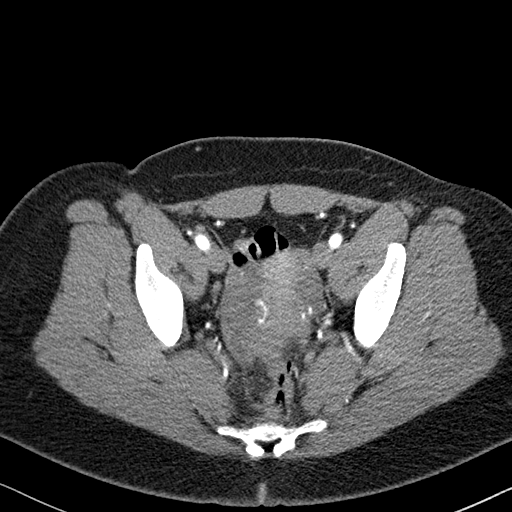
[im 57/193  soft-tissue]
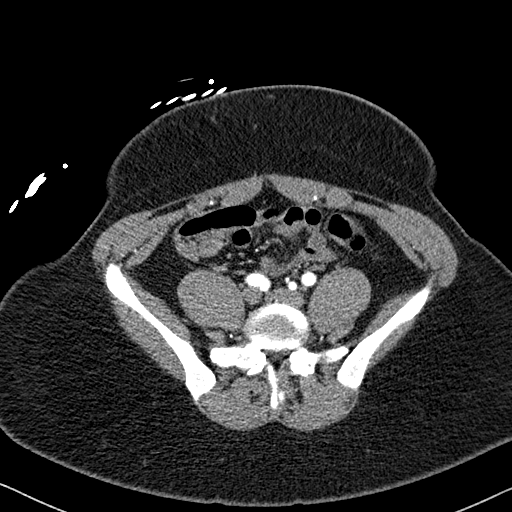
[im 68/193  soft-tissue]
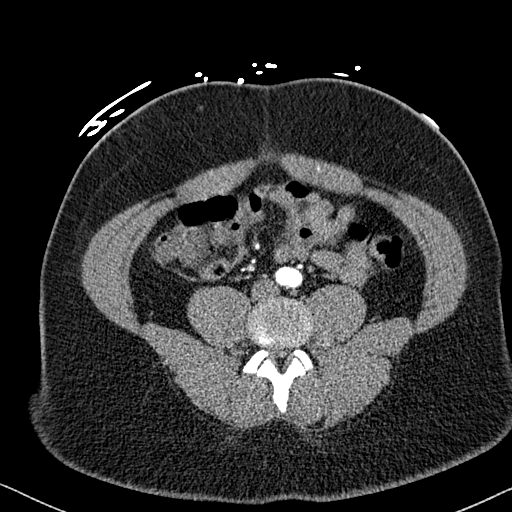
[im 91/193  soft-tissue]
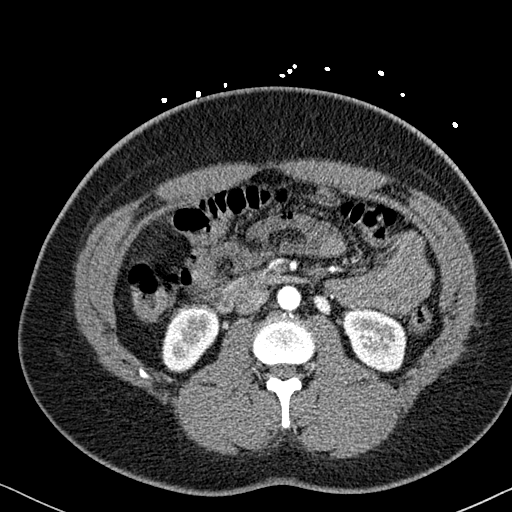
[im 102/193  soft-tissue]
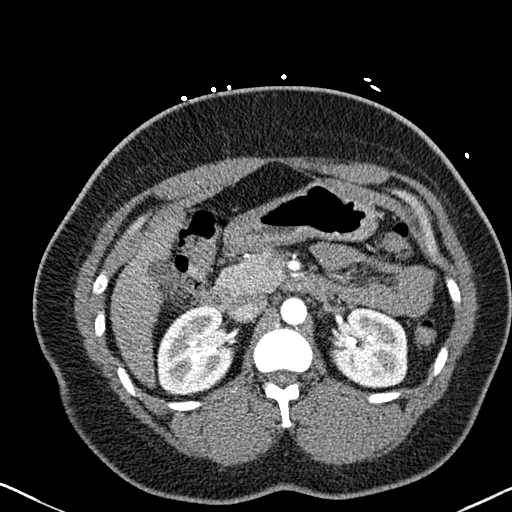
[im 125/193  soft-tissue]
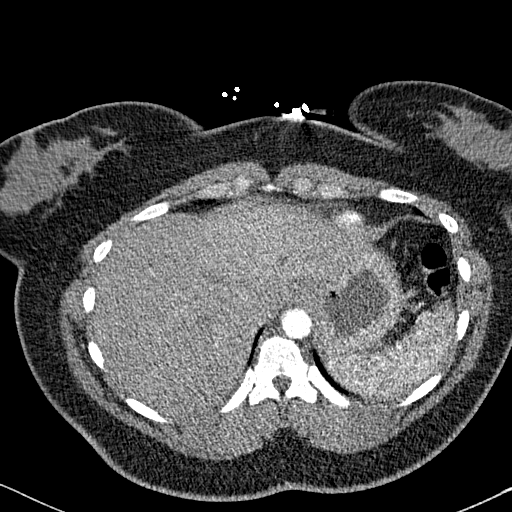
[im 147/193  soft-tissue]
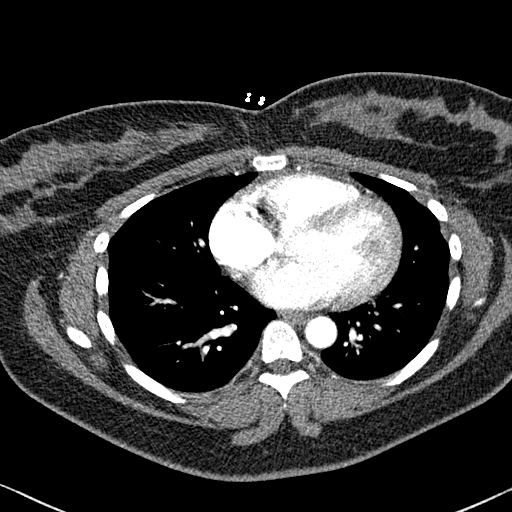
[im 159/193  soft-tissue]
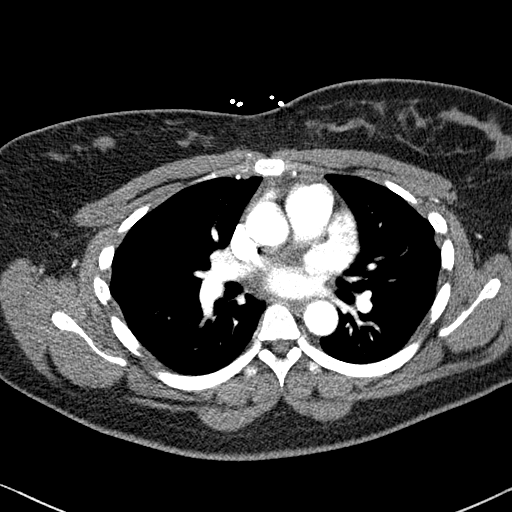
[im 159/193  bone]
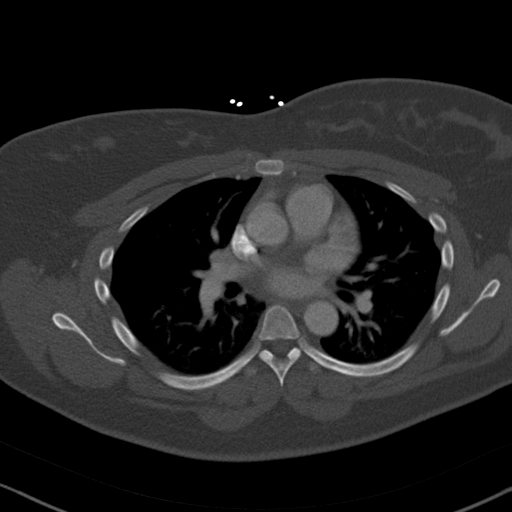
[im 181/193  soft-tissue]
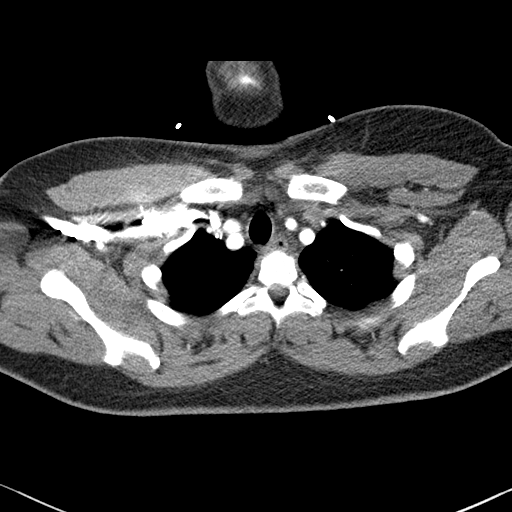

[Series 9: coronals · coronal · 0.72mm/px · 3 of 139 slices shown]
[im 35/139  soft-tissue]
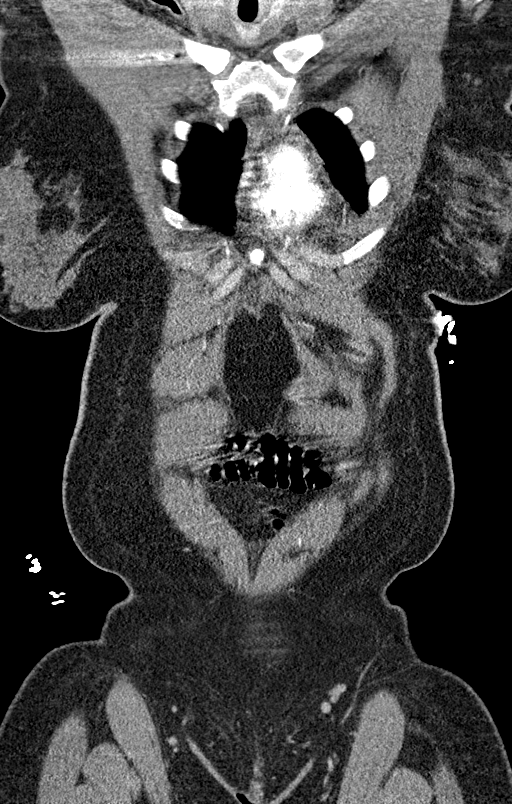
[im 70/139  soft-tissue]
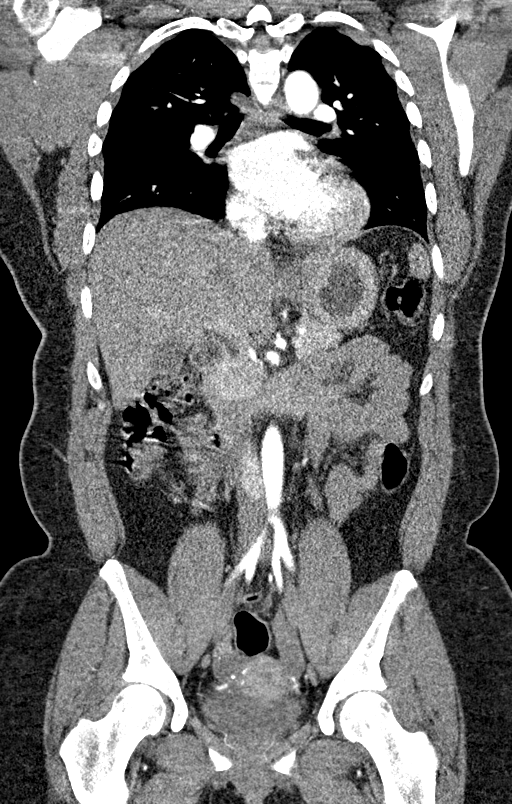
[im 104/139  soft-tissue]
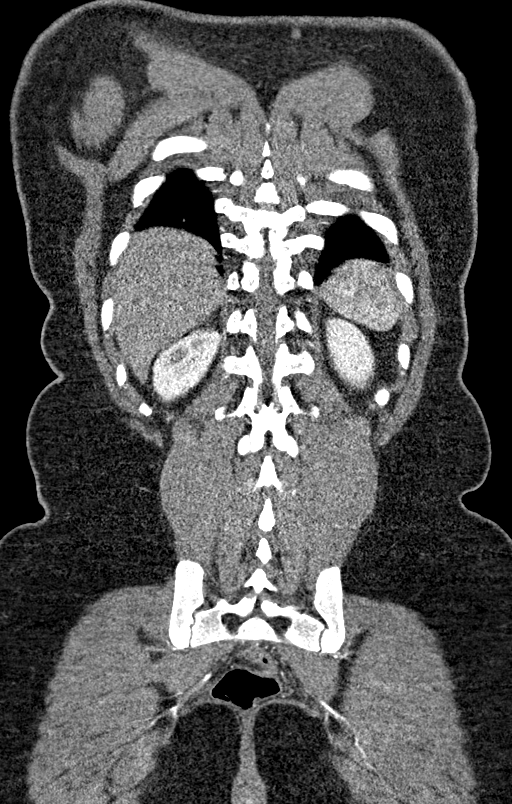

[13 of 46 positions shown; findings below may reference images not displayed]

Multidetector CT imaging through the chest, abdomen and pelvis was
performed using the standard protocol during bolus administration of
intravenous contrast. Multiplanar reconstructed images and MIPs were
obtained and reviewed to evaluate the vascular anatomy.

CONTRAST:  100mL OMNIPAQUE IOHEXOL 350 MG/ML SOLN
FINDINGS: CTA CHEST FINDINGS

Cardiovascular: There is no appreciable intramural hematoma on
noncontrast enhanced study. There is no appreciable thoracic aortic
aneurysm or dissection. The visualized great vessels appear
unremarkable. No mucosal irregularities noted in the thoracic aorta
or great vessels. No pulmonary embolus is demonstrable. There is no
pericardial effusion or pericardial thickening. There is trace
coronary artery calcification.

Mediastinum/Nodes: Thyroid appears normal. No evident thoracic
adenopathy. No esophageal lesions are appreciable. No
pneumomediastinum.

Lungs/Pleura: No evident pneumothorax. The lungs are clear. No
pleural effusions are evident.

Musculoskeletal: There are no blastic or lytic bone lesions. There
are no appreciable chest wall lesions.

Review of the MIP images confirms the above findings.

CTA ABDOMEN AND PELVIS FINDINGS

VASCULAR

Aorta: No abdominal aortic aneurysm or dissection. No mucosal
lesions evident in the aorta.

Celiac: Celiac artery and its branches are widely patent without
appreciable atherosclerotic plaque. No aneurysm or dissection
involving these vessels.

SMA: Superior mesenteric artery and its branches are widely patent.
No aneurysm or dissection involving these vessels.

Renals: There is a single renal artery bilaterally. Renal arteries
and their respective branches are widely patent. There is no
appreciable fibromuscular dysplasia.

IMA: Inferior mesenteric artery and its branches are patent. No
aneurysm or dissection involving these vessels.

Inflow: Pelvic arterial vessels are widely patent throughout their
respective courses without appreciable atherosclerotic plaque. No
aneurysm or dissection involving these vessels. Proximal profunda
femoral and superficial femoral arteries widely patent.

Veins: No obvious venous abnormality within the limitations of this
arterial phase study. Retroaortic left renal vein noted
incidentally.

Review of the MIP images confirms the above findings.

NON-VASCULAR

Hepatobiliary: No focal liver lesions are appreciable. Gallbladder
wall is not appreciably thickened. There is no biliary duct
dilatation.

Pancreas: No pancreatic mass or inflammatory focus.

Spleen: No splenic lesions are evident.

Adrenals/Urinary Tract: Adrenals bilaterally appear normal. Kidneys
bilaterally show no evident mass or hydronephrosis on either side.
No intrarenal calculi are noted. Note that contrast in the
collecting systems could potentially mask small calculi. There is no
evident ureteral calculus on either side. Urinary bladder is midline
with wall thickness within normal limits.

Stomach/Bowel: There is no appreciable bowel wall or mesenteric
thickening. No evident bowel obstruction. The terminal ileum appears
normal. There is no evident free air or portal venous air.

Lymphatic: No evident adenopathy in the abdomen or pelvis.

Reproductive: Uterus is anteverted.  No pelvic mass evident.

Other: Appendix appears normal. No evident abscess or ascites in the
abdomen or pelvis. There is a small umbilical hernia containing only
fat.

Musculoskeletal: No blastic or lytic bone lesions. No intramuscular
lesions are evident.

Review of the MIP images confirms the above findings.
IMPRESSION: CT angiogram chest:

1. No thoracic aortic aneurysm or dissection. Trace coronary artery
calcification noted.

2.  No evident pulmonary embolus.

3.  Lungs clear.

4.  No evident thoracic adenopathy.

CT angiogram abdomen; CT angiogram pelvis:

1. There is no aneurysm or dissection involving the aorta, major
mesenteric, and major pelvic arterial vessels. No fibromuscular
dysplasia. No appreciable atherosclerotic plaque in these vessels.

2. No bowel wall thickening or bowel obstruction. No abscess in the
abdomen or pelvis. Appendix appears normal.

3. No hydronephrosis. No renal or ureteral calculi evident. Note
that contrast in the renal collecting systems could potentially mask
small calculi. Urinary bladder wall thickness normal.

4.  Small umbilical hernia containing only fat.

## 2023-09-03 ENCOUNTER — Ambulatory Visit: Payer: 59 | Admitting: Nurse Practitioner

## 2023-11-11 ENCOUNTER — Ambulatory Visit: Payer: 59 | Admitting: Family Medicine

## 2024-03-09 ENCOUNTER — Emergency Department (HOSPITAL_BASED_OUTPATIENT_CLINIC_OR_DEPARTMENT_OTHER)
Admission: EM | Admit: 2024-03-09 | Discharge: 2024-03-09 | Disposition: A | Discharge status: Home / Self Care | Payer: Self-pay | Attending: Emergency Medicine | Admitting: Emergency Medicine

## 2024-03-09 ENCOUNTER — Encounter (HOSPITAL_BASED_OUTPATIENT_CLINIC_OR_DEPARTMENT_OTHER): Payer: Self-pay | Admitting: Emergency Medicine

## 2024-03-09 DIAGNOSIS — D171 Benign lipomatous neoplasm of skin and subcutaneous tissue of trunk: Secondary | ICD-10-CM | POA: Insufficient documentation

## 2024-03-09 DIAGNOSIS — L731 Pseudofolliculitis barbae: Secondary | ICD-10-CM | POA: Insufficient documentation

## 2024-03-09 MED ORDER — CEPHALEXIN 250 MG PO CAPS
500.0000 mg | ORAL_CAPSULE | Freq: Once | ORAL | Status: AC
  Administered 2024-03-09: 500 mg via ORAL
  Filled 2024-03-09: qty 2

## 2024-03-09 MED ORDER — CEPHALEXIN 500 MG PO CAPS: 500.0000 mg | ORAL_CAPSULE | Freq: Four times a day (QID) | ORAL | 0 refills | Status: AC

## 2024-03-09 MED ORDER — LIDOCAINE-EPINEPHRINE-TETRACAINE (LET) TOPICAL GEL
3.0000 mL | Freq: Once | TOPICAL | Status: AC
  Administered 2024-03-09: 3 mL via TOPICAL
  Filled 2024-03-09: qty 3

## 2024-03-09 NOTE — ED Triage Notes (Signed)
 Pt states had Boil/bump on back, states has had it for years, it normally gets sore and then calms down. This time swelling and pain for  week and not getting better.

## 2024-03-09 NOTE — ED Provider Notes (Signed)
 [facility] Provider Note   CSN: [id number] Arrival date & time: 03/09/24  0604     History  Chief Complaint  Patient presents with   Abscess    Lisa Gibson is a 28 y.o. female.  The history is provided by the patient.  Abscess Abscess quality: not draining and no redness   Red streaking: no   Progression:  Worsening Chronicity:  Chronic Relieved by:  Nothing Worsened by:  Nothing Ineffective treatments:  None tried Associated symptoms: no fever   Risk factors: no family hx of MRSA        Home Medications Prior to Admission medications   Medication Sig Start Date End Date Taking? Authorizing Provider  cephALEXin  (KEFLEX ) 500 MG capsule Take 1 capsule (500 mg total) by mouth 4 (four) times daily. 03/09/24  Yes Rodrickus Min, MD  ergocalciferol (VITAMIN D2) 1.25 MG (50000 UT) capsule Take 50,000 Units by mouth once a week.   Yes [provider]  amLODipine  (NORVASC ) 5 MG tablet Take 1 tablet (5 mg total) by mouth daily. 04/15/20   Rancour, Mara [place], MD  omeprazole  (PRILOSEC) 20 MG capsule Take 1 capsule (20 mg total) by mouth daily. 04/15/20   Rancour, Mara [place]in[place], MD  ranitidine  (ZANTAC ) 150 MG tablet Take 1 tablet (150 mg total) by mouth 2 (two) times daily. 11/28/12 10/13/14  Angeline Barefoot, MD      Allergies    Patient has no allergy information on record.    Review of Systems   Review of Systems  Constitutional:  Negative for fever.  All other systems reviewed and are negative.   Physical Exam Updated Vital Signs BP (!) 135/101 (BP Location: Right Arm)   Pulse 97   Temp 98.4 F (36.9 C) (Oral)   Resp 20   Ht 5\' 4"  (1.626 m)   Wt 97.5 kg   LMP 02/23/2024 (Exact Date)   SpO2 100%   BMI 36.90 kg/m  Physical Exam Vitals and nursing note reviewed. Exam conducted with a chaperone present.  Constitutional:      General: She is not in acute distress.    Appearance: Normal appearance. She is well-developed.   HENT:     Head: Normocephalic and atraumatic.  Eyes:     Pupils: Pupils are equal, round, and reactive to light.  Cardiovascular:     Rate and Rhythm: Normal rate and regular rhythm.     Pulses: Normal pulses.     Heart sounds: Normal heart sounds.  Pulmonary:     Effort: Pulmonary effort is normal. No respiratory distress.     Breath sounds: Normal breath sounds.  Abdominal:     General: Bowel sounds are normal. There is no distension.     Palpations: Abdomen is soft.     Tenderness: There is no abdominal tenderness. There is no guarding or rebound.  Genitourinary:    Vagina: No vaginal discharge.  Musculoskeletal:        General: Normal range of motion.       Arms:     Cervical back: Normal range of motion and neck supple.  Skin:    General: Skin is dry.     Capillary Refill: Capillary refill takes less than 2 seconds.     Findings: No erythema or rash.  Neurological:     General: No focal deficit present.     Mental Status: She is alert.     Deep Tendon Reflexes: Reflexes normal.  Psychiatric:  Mood and Affect: Mood normal.     ED Results / Procedures / Treatments   Labs (all labs ordered are listed, but only abnormal results are displayed) Labs Reviewed - No data to display  EKG None  Radiology No results found.  Procedures Procedures    Medications Ordered in ED Medications  lidocaine -EPINEPHrine-tetracaine (LET) topical gel (has no administration in time range)  cephALEXin  (KEFLEX ) capsule 500 mg (has no administration in time range)    ED Course/ Medical Decision Making/ A&P                                 Medical Decision Making Patient with back lesion for years that is intermittently irritated    Amount and/or Complexity of Data Reviewed External Data Reviewed: notes.    Details: Previous notes reviewed   Risk Prescription drug management. Risk Details: Per pharmacy, patient has successfully had keflex  in the past will start this  medication.  Tylenol  and Ibuprofen,  there is no abscess at this time.  Stable for discharge.       Final Clinical Impression(s) / ED Diagnoses Final diagnoses:  Lipoma of torso  Ingrown hair     No signs of systemic illness or infection. The patient is nontoxic-appearing on exam and vital signs are within normal limits.  I have reviewed the triage vital signs and the nursing notes. Pertinent labs & imaging results that were available during my care of the patient were reviewed by me and considered in my medical decision making (see chart for details). After history, exam, and medical workup I feel the patient has been appropriately medically screened and is safe for discharge home. Pertinent diagnoses were discussed with the patient. Patient was given return precautions.  Rx / DC Orders ED Discharge Orders          Ordered    cephALEXin  (KEFLEX ) 500 MG capsule  4 times daily        03/09/24 0627              Tucker Steedley, MD 03/09/24 [phone]

## 2024-04-14 ENCOUNTER — Ambulatory Visit
Admission: EM | Admit: 2024-04-14 | Discharge: 2024-04-14 | Disposition: A | Discharge status: Home / Self Care | Attending: Family Medicine | Admitting: Family Medicine

## 2024-04-14 DIAGNOSIS — R110 Nausea: Secondary | ICD-10-CM

## 2024-04-14 DIAGNOSIS — J309 Allergic rhinitis, unspecified: Secondary | ICD-10-CM

## 2024-04-14 LAB — POC SARS CORONAVIRUS 2 AG -  ED: SARS Coronavirus 2 Ag: NEGATIVE

## 2024-04-14 MED ORDER — FLUTICASONE PROPIONATE 50 MCG/ACT NA SUSP: 1.0000 | Freq: Every day | NASAL | 0 refills | Status: AC

## 2024-04-14 MED ORDER — ONDANSETRON 4 MG PO TBDP: 4.0000 mg | ORAL_TABLET | Freq: Three times a day (TID) | ORAL | 0 refills | Status: DC | PRN

## 2024-04-14 MED ORDER — PREDNISONE 20 MG PO TABS
40.0000 mg | ORAL_TABLET | Freq: Every day | ORAL | 0 refills | Status: AC
Start: 2024-04-14 — End: 2024-04-19

## 2024-04-14 MED ORDER — CETIRIZINE HCL 10 MG PO TABS: 10.0000 mg | ORAL_TABLET | Freq: Every day | ORAL | 0 refills | Status: DC

## 2024-04-14 NOTE — ED Triage Notes (Signed)
 Pt present with c/o nasal congestion, sneezing and headaches X  four days. Pt states she cannot smell or taste anything.   Denies fever, sore throat and cough.  Home interventions: tylenol  with some relief

## 2024-04-14 NOTE — ED Provider Notes (Signed)
 [facility]    CSN: [id number] Arrival date & time: 04/14/24  9047      History   Chief Complaint No chief complaint on file.   HPI Lisa Gibson is a 28 y.o. female  presents for evaluation of URI symptoms for 4 days. Patient reports associated symptoms of nasal congestion/sneezing with headaches and nausea.  Does report loss of taste and smell.  Denies N/V/D, cough, sore throat, ear pain, fevers, vomiting, shortness of breath. Patient does not have a hx of asthma. Patient does not an active smoker.   Reports no sick contacts.  Pt has taken Tylenol  OTC for symptoms. Pt has no other concerns at this time.   HPI  Past Medical History:  Diagnosis Date   Hypertension     There are no active problems to display for this patient.   History reviewed. No pertinent surgical history.  OB History   No obstetric history on file.      Home Medications    Prior to Admission medications   Medication Sig Start Date End Date Taking? Authorizing Provider  cetirizine  (ZYRTEC ) 10 MG tablet Take 1 tablet (10 mg total) by mouth daily for 14 days. 04/14/24 04/28/24 Yes Merari Pion, Jodi R, NP  fluticasone  (FLONASE ) 50 MCG/ACT nasal spray Place 1 spray into both nostrils daily. 04/14/24  Yes Calogero Geisen, Jodi R, NP  ondansetron  (ZOFRAN -ODT) 4 MG disintegrating tablet Take 1 tablet (4 mg total) by mouth every 8 (eight) hours as needed for nausea or vomiting. 04/14/24  Yes Elyza Whitt, Jodi R, NP  predniSONE  (DELTASONE ) 20 MG tablet Take 2 tablets (40 mg total) by mouth daily with breakfast for 5 days. 04/14/24 04/19/24 Yes Nikko Quast, Jodi R, NP  amLODipine  (NORVASC ) 5 MG tablet Take 1 tablet (5 mg total) by mouth daily. 04/15/20   Rancour, Garnette, MD  cephALEXin  (KEFLEX ) 500 MG capsule Take 1 capsule (500 mg total) by mouth 4 (four) times daily. 03/09/24   Palumbo, April, MD  ergocalciferol (VITAMIN D2) 1.25 MG (50000 UT) capsule Take 50,000 Units by mouth once a week.    [provider]  omeprazole   (PRILOSEC) 20 MG capsule Take 1 capsule (20 mg total) by mouth daily. 04/15/20   Rancour, Garnette, MD  ranitidine  (ZANTAC ) 150 MG tablet Take 1 tablet (150 mg total) by mouth 2 (two) times daily. 11/28/12 10/13/14  Rhae Lye, MD    Family History History reviewed. No pertinent family history.  Social History Social History   Tobacco Use   Smoking status: Former    Current packs/day: 0.50    Types: Cigarettes   Smokeless tobacco: Never  Vaping Use   Vaping status: Never Used  Substance Use Topics   Alcohol use: Not Currently    Comment: weekly   Drug use: No     Allergies   Patient has no known allergies.   Review of Systems Review of Systems  HENT:  Positive for congestion and sneezing.   Gastrointestinal:  Positive for nausea.  Neurological:  Positive for headaches.     Physical Exam Triage Vital Signs ED Triage Vitals  Encounter Vitals Group     BP 04/14/24 1023 (!) 137/96     Girls Systolic BP Percentile --      Girls Diastolic BP Percentile --      Boys Systolic BP Percentile --      Boys Diastolic BP Percentile --      Pulse Rate 04/14/24 1022 (!) 105     Resp 04/14/24 1022  16     Temp 04/14/24 1022 98.4 F (36.9 C)     Temp Source 04/14/24 1022 Oral     SpO2 04/14/24 1022 97 %     Weight --      Height --      Head Circumference --      Peak Flow --      Pain Score 04/14/24 1021 6     Pain Loc --      Pain Education --      Exclude from Growth Chart --    No data found.  Updated Vital Signs BP (!) 137/96   Pulse (!) 105   Temp 98.4 F (36.9 C) (Oral)   Resp 16   LMP 04/08/2024 (Exact Date)   SpO2 97%   Visual Acuity Right Eye Distance:   Left Eye Distance:   Bilateral Distance:    Right Eye Near:   Left Eye Near:    Bilateral Near:     Physical Exam Vitals and nursing note reviewed.  Constitutional:      General: Lisa Gibson is not in acute distress.    Appearance: Lisa Gibson is well-developed. Lisa Gibson is not ill-appearing.  HENT:     Head:  Normocephalic and atraumatic.     Right Ear: Tympanic membrane and ear canal normal.     Left Ear: Tympanic membrane and ear canal normal.     Nose: Congestion present.     Mouth/Throat:     Mouth: Mucous membranes are moist.     Pharynx: Oropharynx is clear. Uvula midline. No posterior oropharyngeal erythema.     Tonsils: No tonsillar exudate or tonsillar abscesses.  Eyes:     Conjunctiva/sclera: Conjunctivae normal.     Pupils: Pupils are equal, round, and reactive to light.  Cardiovascular:     Rate and Rhythm: Normal rate and regular rhythm.     Heart sounds: Normal heart sounds.  Pulmonary:     Effort: Pulmonary effort is normal.     Breath sounds: Normal breath sounds. No wheezing, rhonchi or rales.  Musculoskeletal:     Cervical back: Normal range of motion and neck supple.  Lymphadenopathy:     Cervical: No cervical adenopathy.  Skin:    General: Skin is warm and dry.  Neurological:     General: No focal deficit present.     Mental Status: Lisa Gibson is alert and oriented to person, place, and time.  Psychiatric:        Mood and Affect: Mood normal.        Behavior: Behavior normal.      UC Treatments / Results  Labs (all labs ordered are listed, but only abnormal results are displayed) Labs Reviewed  POC SARS CORONAVIRUS 2 AG -  ED    EKG   Radiology No results found.  Procedures Procedures (including critical care time)  Medications Ordered in UC Medications - No data to display  Initial Impression / Assessment and Plan / UC Course  I have reviewed the triage vital signs and the nursing notes.  Pertinent labs & imaging results that were available during my care of the patient were reviewed by me and considered in my medical decision making (see chart for details).  Clinical Course as of 04/14/24 1229  Tue Apr 14, 2024  1152 HR recheck 94 [JM]    Clinical Course User Index [JM] Loreda Myla SAUNDERS, NP    Reviewed exam and symptoms with patient.  No red  flags.  Negative COVID  testing.  Discussed likely allergic rhinitis.  Will do trial of cetirizine , Flonase , prednisone .  Patient does endorse nausea secondary to postnasal drip will do Zofran  as needed.  Discussed nasal rinses as tolerated.  Lots of rest and fluids.  PCP follow-up 2 to 3 days for recheck.  ER precautions reviewed. Final Clinical Impressions(s) / UC Diagnoses   Final diagnoses:  Allergic rhinitis, unspecified seasonality, unspecified trigger  Nausea without vomiting     Discharge Instructions      Tested negative for COVID.  He may start cetirizine  daily for 14 days.  Flonase  daily for nasal congestion.  Prednisone  daily for 5 days.  He may do nasal rinses as tolerated.  He may take Zofran  as needed for nausea.  Lots of rest and fluids.  Please follow-up with your PCP in 2 to 3 days for recheck.  Please go to the ER for any worsening symptoms.  Hope you feel better soon!     ED Prescriptions     Medication Sig Dispense Auth. Provider   fluticasone  (FLONASE ) 50 MCG/ACT nasal spray Place 1 spray into both nostrils daily. 15.8 mL Osha Rane, Jodi R, NP   predniSONE  (DELTASONE ) 20 MG tablet Take 2 tablets (40 mg total) by mouth daily with breakfast for 5 days. 10 tablet Simonne Boulos, Jodi R, NP   cetirizine  (ZYRTEC ) 10 MG tablet Take 1 tablet (10 mg total) by mouth daily for 14 days. 14 tablet Keshun Berrett, Jodi R, NP   ondansetron  (ZOFRAN -ODT) 4 MG disintegrating tablet Take 1 tablet (4 mg total) by mouth every 8 (eight) hours as needed for nausea or vomiting. 8 tablet Hulet Ehrmann, Jodi R, NP      PDMP not reviewed this encounter.   Loreda Myla SAUNDERS, NP 04/14/24 1229

## 2024-04-14 NOTE — Discharge Instructions (Addendum)
 You Tested negative for COVID.  you may start cetirizine  daily for 14 days.  Flonase  daily for nasal congestion.  Prednisone  daily for 5 days.  nasal rinses as tolerated.  You may take Zofran  as needed for nausea.  Lots of rest and fluids.  Please follow-up with your PCP in 2 to 3 days for recheck.  Please go to the ER for any worsening symptoms.  Hope you feel better soon!

## 2024-08-31 ENCOUNTER — Ambulatory Visit
Admission: EM | Admit: 2024-08-31 | Discharge: 2024-08-31 | Disposition: A | Discharge status: Home / Self Care | Attending: Nurse Practitioner | Admitting: Nurse Practitioner

## 2024-08-31 DIAGNOSIS — U071 COVID-19: Secondary | ICD-10-CM | POA: Diagnosis not present

## 2024-08-31 DIAGNOSIS — R051 Acute cough: Secondary | ICD-10-CM

## 2024-08-31 DIAGNOSIS — J029 Acute pharyngitis, unspecified: Secondary | ICD-10-CM

## 2024-08-31 LAB — POC COVID19/FLU A&B COMBO
Covid Antigen, POC: POSITIVE — AB
Influenza A Antigen, POC: NEGATIVE
Influenza B Antigen, POC: NEGATIVE

## 2024-08-31 MED ORDER — ACETAMINOPHEN 325 MG PO TABS
650.0000 mg | ORAL_TABLET | Freq: Once | ORAL | Status: AC
  Administered 2024-08-31: 650 mg via ORAL

## 2024-08-31 MED ORDER — ONDANSETRON 4 MG PO TBDP: 4.0000 mg | ORAL_TABLET | Freq: Three times a day (TID) | ORAL | 0 refills | Status: AC | PRN

## 2024-08-31 NOTE — Discharge Instructions (Signed)
 COVID 19 testing is positive Stay home until your symptoms begin to improve Tylenol , Motrin, Coricidin HBP Cough/Cold to manage your symptoms safely with a known history of high blood pressure Ensure adequate rest and oral hydration Work note has been provided Zofran  prescribed to help alleviate the nausea

## 2024-08-31 NOTE — ED Provider Notes (Signed)
 [facility]    CSN: [id number] Arrival date & time: 08/31/24  1224      History   Chief Complaint Chief Complaint  Patient presents with   Headache   Generalized Body Aches    HPI Lisa Gibson is a 28 y.o. female.   Patient presents for evaluation of headache, nasal congestion, sore throat, cough, nausea, and generally feeling unwell.  Symptom onset was this past Thursday, intensified on Saturday.  Has been utilizing Tylenol  to help manage symptoms.  No reported fever, chest pain, shortness of breath, abdominal pain, vomiting, or diarrhea.   Headache Associated symptoms: congestion, cough, fatigue, myalgias, nausea and sore throat   Associated symptoms: no abdominal pain, no diarrhea, no dizziness, no eye pain, no fever and no vomiting     Past Medical History:  Diagnosis Date   Hypertension     There are no active problems to display for this patient.   History reviewed. No pertinent surgical history.  OB History   No obstetric history on file.      Home Medications    Prior to Admission medications   Medication Sig Start Date End Date Taking? Authorizing Provider  ondansetron  (ZOFRAN -ODT) 4 MG disintegrating tablet Take 1 tablet (4 mg total) by mouth every 8 (eight) hours as needed for nausea or vomiting. 08/31/24  Yes Janet Therisa PARAS, FNP  amLODipine  (NORVASC ) 5 MG tablet Take 1 tablet (5 mg total) by mouth daily. 04/15/20   Rancour, Garnette, MD  cephALEXin  (KEFLEX ) 500 MG capsule Take 1 capsule (500 mg total) by mouth 4 (four) times daily. 03/09/24   Palumbo, April, MD  cetirizine  (ZYRTEC ) 10 MG tablet Take 1 tablet (10 mg total) by mouth daily for 14 days. 04/14/24 04/28/24  Mayer, Jodi R, NP  ergocalciferol (VITAMIN D2) 1.25 MG (50000 UT) capsule Take 50,000 Units by mouth once a week.    [provider]  fluticasone  (FLONASE ) 50 MCG/ACT nasal spray Place 1 spray into both nostrils daily. 04/14/24   Mayer, Jodi R, NP  omeprazole  (PRILOSEC) 20 MG  capsule Take 1 capsule (20 mg total) by mouth daily. 04/15/20   Rancour, Garnette, MD  ranitidine  (ZANTAC ) 150 MG tablet Take 1 tablet (150 mg total) by mouth 2 (two) times daily. 11/28/12 10/13/14  Rhae Lye, MD    Family History History reviewed. No pertinent family history.  Social History Social History   Tobacco Use   Smoking status: Former    Current packs/day: 0.50    Types: Cigarettes   Smokeless tobacco: Never  Vaping Use   Vaping status: Never Used  Substance Use Topics   Alcohol use: Not Currently    Comment: weekly   Drug use: No     Allergies   Patient has no known allergies.   Review of Systems Review of Systems  Constitutional:  Positive for fatigue. Negative for chills and fever.  HENT:  Positive for congestion, rhinorrhea and sore throat.   Eyes:  Negative for pain and redness.  Respiratory:  Positive for cough. Negative for shortness of breath.   Cardiovascular:  Negative for chest pain.  Gastrointestinal:  Positive for nausea. Negative for abdominal pain, diarrhea and vomiting.  Genitourinary:  Negative for dysuria and urgency.  Musculoskeletal:  Positive for myalgias. Negative for arthralgias.  Skin:  Negative for rash.  Neurological:  Positive for headaches. Negative for dizziness.  Psychiatric/Behavioral:  Negative for sleep disturbance.      Physical Exam Triage Vital Signs ED Triage Vitals  Encounter Vitals Group     BP 08/31/24 1332 126/86     Girls Systolic BP Percentile --      Girls Diastolic BP Percentile --      Boys Systolic BP Percentile --      Boys Diastolic BP Percentile --      Pulse Rate 08/31/24 1331 (!) 117     Resp --      Temp 08/31/24 1332 (!) 100.9 F (38.3 C)     Temp src --      SpO2 08/31/24 1331 95 %     Weight --      Height --      Head Circumference --      Peak Flow --      Pain Score 08/31/24 1330 5     Pain Loc --      Pain Education --      Exclude from Growth Chart --    No data found.  Updated  Vital Signs BP 126/86   Pulse (!) 117   Temp (!) 100.9 F (38.3 C)   LMP 05/31/2024 (Exact Date) Comment: has had her period for 3 months per patient  SpO2 95%   Visual Acuity Right Eye Distance:   Left Eye Distance:   Bilateral Distance:    Right Eye Near:   Left Eye Near:    Bilateral Near:     Physical Exam Vitals and nursing note reviewed.  Constitutional:      Appearance: Normal appearance. She is well-developed.  HENT:     Head: Normocephalic.     Right Ear: Tympanic membrane and ear canal normal.     Left Ear: Tympanic membrane and ear canal normal.     Nose: Congestion present.     Mouth/Throat:     Mouth: Mucous membranes are moist.     Pharynx: No posterior oropharyngeal erythema.  Eyes:     Conjunctiva/sclera: Conjunctivae normal.     Pupils: Pupils are equal, round, and reactive to light.  Cardiovascular:     Rate and Rhythm: Regular rhythm. Tachycardia present.     Pulses: Normal pulses.     Heart sounds: Normal heart sounds. No murmur heard.    Comments: Febrile in clinic Pulmonary:     Effort: Pulmonary effort is normal.     Breath sounds: Normal breath sounds. No wheezing, rhonchi or rales.  Abdominal:     General: Bowel sounds are normal.  Skin:    General: Skin is warm and dry.  Neurological:     General: No focal deficit present.     Mental Status: She is alert and oriented to person, place, and time.  Psychiatric:        Mood and Affect: Mood normal.        Behavior: Behavior normal.        Thought Content: Thought content normal.        Judgment: Judgment normal.    UC Treatments / Results  Labs (all labs ordered are listed, but only abnormal results are displayed) Labs Reviewed  POC COVID19/FLU A&B COMBO - Abnormal; Notable for the following components:      Result Value   Covid Antigen, POC Positive (*)    All other components within normal limits   EKG  Radiology No results found.  Procedures Procedures (including critical  care time)  Medications Ordered in UC Medications  acetaminophen  (TYLENOL ) tablet 650 mg (650 mg Oral Given 08/31/24 1336)    Initial Impression /  Assessment and Plan / UC Course  I have reviewed the triage vital signs and the nursing notes.  Pertinent labs & imaging results that were available during my care of the patient were reviewed by me and considered in my medical decision making (see chart for details).    Patient is here for evaluation of URI symptoms and a cough that started this past Thursday.  POCT COVID testing is positive.  Patient has only been using Tylenol  thus far.  I discussed Coricidin HBP cough and cold for safe management of her symptoms due to her history of hypertension.  May also use ibuprofen as needed.  Ensure adequate rest and oral hydration.  Prescription for Zofran  was sent to the pharmacy on file to help alleviate the nausea.  May use electrolyte replacement solution such as Pedialyte and Gatorade as needed.  A work note was provided.  Final Clinical Impressions(s) / UC Diagnoses   Final diagnoses:  COVID-19  Acute cough  Acute pharyngitis, unspecified etiology     Discharge Instructions       COVID 19 testing is positive Stay home until your symptoms begin to improve Tylenol , Motrin, Coricidin HBP Cough/Cold to manage your symptoms safely with a known history of high blood pressure Ensure adequate rest and oral hydration Work note has been provided Zofran  prescribed to help alleviate the nausea     ED Prescriptions     Medication Sig Dispense Auth. Provider   ondansetron  (ZOFRAN -ODT) 4 MG disintegrating tablet Take 1 tablet (4 mg total) by mouth every 8 (eight) hours as needed for nausea or vomiting. 20 tablet Janet Therisa PARAS, FNP      PDMP not reviewed this encounter.   Janet Therisa PARAS, FNP 08/31/24 [phone]

## 2024-08-31 NOTE — ED Triage Notes (Signed)
 Pt present with c.o headaches, body aches, stomach pain X 3 days.  States she has a sore throat and runny nose x 3 days.  Home interventions: none

## 2024-09-10 ENCOUNTER — Ambulatory Visit
Admission: RE | Admit: 2024-09-10 | Discharge: 2024-09-10 | Disposition: A | Discharge status: Home / Self Care | Source: Ambulatory Visit | Attending: Family Medicine | Admitting: Family Medicine

## 2024-09-10 VITALS — BP 132/85 | HR 79 | Temp 98.6°F | Resp 19

## 2024-09-10 DIAGNOSIS — R220 Localized swelling, mass and lump, head: Secondary | ICD-10-CM

## 2024-09-10 DIAGNOSIS — T7840XA Allergy, unspecified, initial encounter: Secondary | ICD-10-CM

## 2024-09-10 MED ORDER — EPINEPHRINE 0.3 MG/0.3ML IJ SOAJ: 0.3000 mg | INTRAMUSCULAR | 0 refills | Status: AC | PRN

## 2024-09-10 MED ORDER — METHYLPREDNISOLONE SODIUM SUCC 125 MG IJ SOLR
80.0000 mg | Freq: Once | INTRAMUSCULAR | Status: AC
  Administered 2024-09-10: 80 mg via INTRAMUSCULAR

## 2024-09-10 MED ORDER — PREDNISONE 10 MG (21) PO TBPK: ORAL_TABLET | Freq: Every day | ORAL | 0 refills | Status: AC

## 2024-09-10 MED ORDER — CETIRIZINE HCL 10 MG PO TABS: 10.0000 mg | ORAL_TABLET | Freq: Every day | ORAL | 0 refills | Status: AC

## 2024-09-10 MED ORDER — CETIRIZINE HCL 10 MG PO TABS
10.0000 mg | ORAL_TABLET | Freq: Every day | ORAL | Status: DC
  Administered 2024-09-10: 10 mg via ORAL

## 2024-09-10 NOTE — Discharge Instructions (Addendum)
 You are given a shot of a steroid as well as oral cetirizine  allergy medicine while in the clinic.  I have also provided you with an EpiPen  to have on hand in case symptoms recur.  If you do need to use this you will need to go to the emergency  for monitoring.  Please continue to monitor your symptoms closely and if they do not continue to improve and/or they worsen please go to the emergency room/call 911 ASAP especially if you develop any difficulty breathing or swallowing.  Start oral cetirizine  tomorrow as well as a prednisone  taper as prescribed.  Follow-up with your PCP in 2 days for recheck.  I hope you feel better soon!

## 2024-09-10 NOTE — ED Provider Notes (Signed)
 [facility]    CSN: [id number] Arrival date & time: 09/10/24  1410      History   Chief Complaint Chief Complaint  Patient presents with   Allergic Reaction    Entered by patient    HPI Lisa Gibson is a 28 y.o. female presents for lip swelling.  Patient reports 1 to 2 hours prior to arrival she noticed swelling of her upper and lower lips that is worsened.  She denies any tongue swelling, throat swelling, difficulty breathing or swallowing.  She does states she feels a lump in her throat but states it is common when she has her acid reflux.  No chest pain, shortness of breath or rashes.  She does states she feels itchy all over.  No nausea vomiting.  States she took some Tums for her GERD and that is the only thing she has taken today.  She is taken Tums many times in the past.  No other new contacts including medications, soaps, detergents, excetra.  No history of anaphylaxis in the past.  She did not take any Benadryl or allergy medicine prior to arrival.  No other concerns at this time.   Allergic Reaction   Past Medical History:  Diagnosis Date   Hypertension     There are no active problems to display for this patient.   History reviewed. No pertinent surgical history.  OB History   No obstetric history on file.      Home Medications    Prior to Admission medications   Medication Sig Start Date End Date Taking? Authorizing Provider  cetirizine  (ZYRTEC ) 10 MG tablet Take 1 tablet (10 mg total) by mouth daily for 7 days. 09/11/24 09/18/24 Yes Baruc Tugwell, Jodi R, NP  EPINEPHrine  0.3 mg/0.3 mL IJ SOAJ injection Inject 0.3 mg into the muscle as needed for anaphylaxis. 09/10/24  Yes Mayan Kloepfer, Jodi R, NP  predniSONE  (STERAPRED UNI-PAK 21 TAB) 10 MG (21) TBPK tablet Take by mouth daily. Take 6 tabs by mouth daily  for 1 day, then 5 tabs for 1 day, then 4 tabs for 1 day, then 3 tabs for 1 day, 2 tabs for 1 day, then 1 tab by mouth daily for 1 days 09/11/24  Yes Ionia Schey,  Jodi R, NP  amLODipine  (NORVASC ) 5 MG tablet Take 1 tablet (5 mg total) by mouth daily. 04/15/20   Rancour, Garnette, MD  cephALEXin  (KEFLEX ) 500 MG capsule Take 1 capsule (500 mg total) by mouth 4 (four) times daily. 03/09/24   Palumbo, April, MD  ergocalciferol (VITAMIN D2) 1.25 MG (50000 UT) capsule Take 50,000 Units by mouth once a week.    [provider]  fluticasone  (FLONASE ) 50 MCG/ACT nasal spray Place 1 spray into both nostrils daily. 04/14/24   Dakota Stangl, Jodi R, NP  omeprazole  (PRILOSEC) 20 MG capsule Take 1 capsule (20 mg total) by mouth daily. 04/15/20   Rancour, Garnette, MD  ondansetron  (ZOFRAN -ODT) 4 MG disintegrating tablet Take 1 tablet (4 mg total) by mouth every 8 (eight) hours as needed for nausea or vomiting. 08/31/24   Janet Therisa PARAS, FNP  ranitidine  (ZANTAC ) 150 MG tablet Take 1 tablet (150 mg total) by mouth 2 (two) times daily. 11/28/12 10/13/14  Rhae Lye, MD    Family History History reviewed. No pertinent family history.  Social History Social History   Tobacco Use   Smoking status: Former    Current packs/day: 0.50    Types: Cigarettes   Smokeless tobacco: Never  Vaping Use  Vaping status: Never Used  Substance Use Topics   Alcohol use: Not Currently    Comment: weekly   Drug use: No     Allergies   Patient has no known allergies.   Review of Systems Review of Systems  HENT:         Lip swelling  Skin:        Pruritus     Physical Exam Triage Vital Signs ED Triage Vitals [09/10/24 1421]  Encounter Vitals Group     BP 132/85     Girls Systolic BP Percentile      Girls Diastolic BP Percentile      Boys Systolic BP Percentile      Boys Diastolic BP Percentile      Pulse Rate 79     Resp 19     Temp 98.6 F (37 C)     Temp src      SpO2 99 %     Weight      Height      Head Circumference      Peak Flow      Pain Score      Pain Loc      Pain Education      Exclude from Growth Chart    No data found.  Updated Vital Signs BP  132/85   Pulse 79   Temp 98.6 F (37 C)   Resp 19   LMP 09/01/2024 (Exact Date) Comment: has had her period for 3 months per patient  SpO2 99%   Visual Acuity Right Eye Distance:   Left Eye Distance:   Bilateral Distance:    Right Eye Near:   Left Eye Near:    Bilateral Near:     Physical Exam Vitals and nursing note reviewed.  Constitutional:      General: She is not in acute distress.    Appearance: Normal appearance. She is not ill-appearing, toxic-appearing or diaphoretic.  HENT:     Head: Normocephalic and atraumatic.     Mouth/Throat:     Mouth: Mucous membranes are moist. Angioedema present.     Tongue: Tongue does not deviate from midline.     Pharynx: Oropharynx is clear. Uvula midline. No pharyngeal swelling or uvula swelling.     Comments: Airway is patent with midline uvula.  Patient speaking in complete sentences without difficulty.  There is moderate swelling of upper and lower lips.  No facial swelling. Eyes:     Extraocular Movements: Extraocular movements intact.     Conjunctiva/sclera: Conjunctivae normal.     Pupils: Pupils are equal, round, and reactive to light.  Cardiovascular:     Rate and Rhythm: Normal rate and regular rhythm.     Heart sounds: Normal heart sounds.  Pulmonary:     Effort: Pulmonary effort is normal. No respiratory distress.     Breath sounds: Normal breath sounds. No wheezing, rhonchi or rales.  Skin:    General: Skin is warm and dry.     Comments: No rashes or hives.  Neurological:     General: No focal deficit present.     Mental Status: She is alert and oriented to person, place, and time.  Psychiatric:        Mood and Affect: Mood normal.        Behavior: Behavior normal.      UC Treatments / Results  Labs (all labs ordered are listed, but only abnormal results are displayed) Labs Reviewed - No data to  display  EKG   Radiology No results found.  Procedures Procedures (including critical care  time)  Medications Ordered in UC Medications  cetirizine  (ZYRTEC ) tablet 10 mg (10 mg Oral Given 09/10/24 1442)  methylPREDNISolone sodium succinate (SOLU-MEDROL) 125 mg/2 mL injection 80 mg (80 mg Intramuscular Given 09/10/24 1442)    Initial Impression / Assessment and Plan / UC Course  I have reviewed the triage vital signs and the nursing notes.  Pertinent labs & imaging results that were available during my care of the patient were reviewed by me and considered in my medical decision making (see chart for details).     Patient given IM Solu-Medrol and oral cetirizine  in clinic.  After 30 minutes patient reports improvement in lip swelling.  Continues to deny shortness of breath, difficulty breathing or swallowing, chest pain.  Patient was monitored for a total of 1 hour with improvement in symptoms.  She continues to deny any shortness of breath, difficulty breathing or swallowing.  Lip swelling is visibly reduced.  Patient is comfortable monitoring symptoms at home.  I did prescribe EpiPen  to have on hand if needed and will do cetirizine  and prednisone  taper tomorrow 12/5.  Advised PCP follow-up in 2 days for recheck.  Strict ER precautions reviewed and patient verbalized understanding. Final Clinical Impressions(s) / UC Diagnoses   Final diagnoses:  Lip swelling  Allergic reaction, initial encounter     Discharge Instructions      You are given a shot of a steroid as well as oral cetirizine  allergy medicine while in the clinic.  I have also provided you with an EpiPen  to have on hand in case symptoms recur.  If you do need to use this you will need to go to the emergency  for monitoring.  Please continue to monitor your symptoms closely and if they do not continue to improve and/or they worsen please go to the emergency room/call 911 ASAP especially if you develop any difficulty breathing or swallowing.  Start oral cetirizine  tomorrow as well as a prednisone  taper as prescribed.   Follow-up with your PCP in 2 days for recheck.  I hope you feel better soon!     ED Prescriptions     Medication Sig Dispense Auth. Provider   predniSONE  (STERAPRED UNI-PAK 21 TAB) 10 MG (21) TBPK tablet Take by mouth daily. Take 6 tabs by mouth daily  for 1 day, then 5 tabs for 1 day, then 4 tabs for 1 day, then 3 tabs for 1 day, 2 tabs for 1 day, then 1 tab by mouth daily for 1 days 21 tablet Weston Fulco, Jodi R, NP   cetirizine  (ZYRTEC ) 10 MG tablet Take 1 tablet (10 mg total) by mouth daily for 7 days. 7 tablet Bascom Biel, Jodi R, NP   EPINEPHrine  0.3 mg/0.3 mL IJ SOAJ injection Inject 0.3 mg into the muscle as needed for anaphylaxis. 1 each Loreda Myla SAUNDERS, NP      PDMP not reviewed this encounter.   Loreda Myla SAUNDERS, NP 09/10/24 1540

## 2024-09-10 NOTE — ED Triage Notes (Addendum)
 Pt present with c/o heart burn x this morning. States she took tums and then noticed her lip swelling. States she feels itching on her body. Denies known allergies Pt states she does not feel SOB. States she feels a lump in her throat
